# Patient Record
Sex: Male | Born: 1935 | Race: White | Hispanic: No | Marital: Married | Smoking: Never smoker
Health system: Southern US, Community
[De-identification: ages and names within clinical notes are randomized; demographics above are authoritative.]

## PROBLEM LIST (undated history)

## (undated) DIAGNOSIS — I10 Essential (primary) hypertension: Secondary | ICD-10-CM

## (undated) DIAGNOSIS — S065X9A Traumatic subdural hemorrhage with loss of consciousness of unspecified duration, initial encounter: Secondary | ICD-10-CM

## (undated) DIAGNOSIS — C61 Malignant neoplasm of prostate: Secondary | ICD-10-CM

## (undated) DIAGNOSIS — R600 Localized edema: Secondary | ICD-10-CM

## (undated) HISTORY — PX: BRAIN SURGERY: SHX531

---

## 2000-06-30 ENCOUNTER — Encounter: Payer: Self-pay | Admitting: Internal Medicine

## 2000-06-30 ENCOUNTER — Encounter: Admission: RE | Admit: 2000-06-30 | Discharge: 2000-06-30 | Payer: Self-pay | Admitting: Internal Medicine

## 2002-02-21 ENCOUNTER — Ambulatory Visit: Admission: RE | Admit: 2002-02-21 | Discharge: 2002-05-01 | Payer: Self-pay | Admitting: Family Medicine

## 2004-10-20 ENCOUNTER — Ambulatory Visit: Payer: Self-pay | Admitting: Gastroenterology

## 2004-11-16 ENCOUNTER — Ambulatory Visit: Payer: Self-pay | Admitting: Gastroenterology

## 2004-11-23 ENCOUNTER — Ambulatory Visit: Payer: Self-pay | Admitting: Gastroenterology

## 2006-01-14 ENCOUNTER — Ambulatory Visit: Payer: Self-pay | Admitting: Gastroenterology

## 2006-02-03 ENCOUNTER — Ambulatory Visit: Payer: Self-pay | Admitting: Gastroenterology

## 2006-02-25 ENCOUNTER — Ambulatory Visit (HOSPITAL_COMMUNITY): Admission: RE | Admit: 2006-02-25 | Discharge: 2006-02-25 | Payer: Self-pay | Admitting: Internal Medicine

## 2006-02-25 ENCOUNTER — Ambulatory Visit: Payer: Self-pay | Admitting: Gastroenterology

## 2007-07-25 ENCOUNTER — Ambulatory Visit: Payer: Self-pay | Admitting: Gastroenterology

## 2007-07-27 DIAGNOSIS — S065XAA Traumatic subdural hemorrhage with loss of consciousness status unknown, initial encounter: Secondary | ICD-10-CM

## 2007-07-27 DIAGNOSIS — S065X9A Traumatic subdural hemorrhage with loss of consciousness of unspecified duration, initial encounter: Secondary | ICD-10-CM

## 2007-07-27 HISTORY — DX: Traumatic subdural hemorrhage with loss of consciousness of unspecified duration, initial encounter: S06.5X9A

## 2007-07-27 HISTORY — DX: Traumatic subdural hemorrhage with loss of consciousness status unknown, initial encounter: S06.5XAA

## 2007-10-30 ENCOUNTER — Encounter: Admission: RE | Admit: 2007-10-30 | Discharge: 2007-10-30 | Payer: Self-pay | Admitting: Neurosurgery

## 2007-11-08 ENCOUNTER — Inpatient Hospital Stay (HOSPITAL_COMMUNITY): Admission: AD | Admit: 2007-11-08 | Discharge: 2007-11-14 | Payer: Self-pay | Admitting: Neurosurgery

## 2007-11-13 ENCOUNTER — Ambulatory Visit: Payer: Self-pay | Admitting: Physical Medicine & Rehabilitation

## 2007-11-14 ENCOUNTER — Inpatient Hospital Stay (HOSPITAL_COMMUNITY)
Admission: RE | Admit: 2007-11-14 | Discharge: 2007-11-21 | Payer: Self-pay | Admitting: Physical Medicine & Rehabilitation

## 2008-01-01 ENCOUNTER — Encounter
Admission: RE | Admit: 2008-01-01 | Discharge: 2008-01-03 | Payer: Self-pay | Admitting: Physical Medicine & Rehabilitation

## 2008-01-03 ENCOUNTER — Ambulatory Visit: Payer: Self-pay | Admitting: Physical Medicine & Rehabilitation

## 2008-01-10 ENCOUNTER — Encounter: Payer: Self-pay | Admitting: Gastroenterology

## 2008-01-11 ENCOUNTER — Encounter: Admission: RE | Admit: 2008-01-11 | Discharge: 2008-01-11 | Payer: Self-pay | Admitting: Neurosurgery

## 2008-04-22 ENCOUNTER — Emergency Department (HOSPITAL_COMMUNITY): Admission: EM | Admit: 2008-04-22 | Discharge: 2008-04-22 | Payer: Self-pay | Admitting: Emergency Medicine

## 2009-07-31 IMAGING — CT CT HEAD W/O CM
1 series · 15 of 30 positions shown, 19 images · non-contrast
Comparison: [place] 10/07/[place].
The images are not available for review.

CLINICAL DATA: Follow-up subdural hematoma

CT HEAD WITHOUT CONTRAST
TECHNIQUE: Contiguous axial images were obtained from the base of
the skull through the vertex without contrast.

[Series 33: 3d filtered head · axial · 0.49mm/px · z∈[+17,+166]mm · 15 of 32 slices shown, 19 images]
[im 2/32  brain]
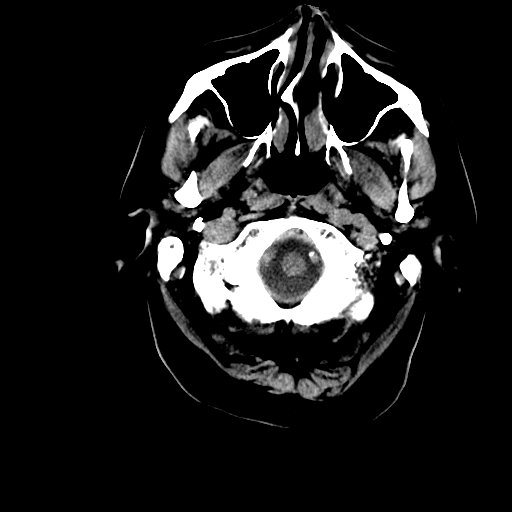
[im 2/32  bone]
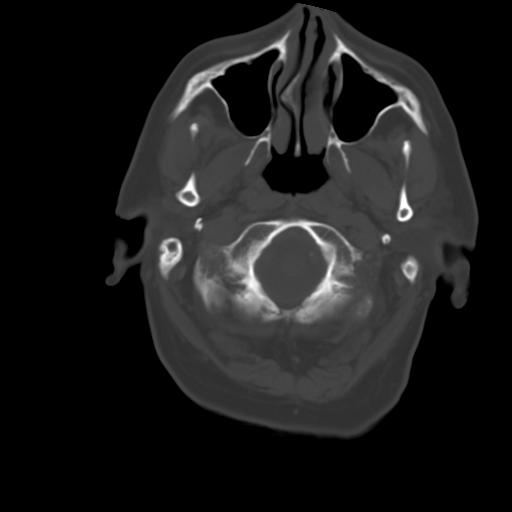
[im 4/32  brain]
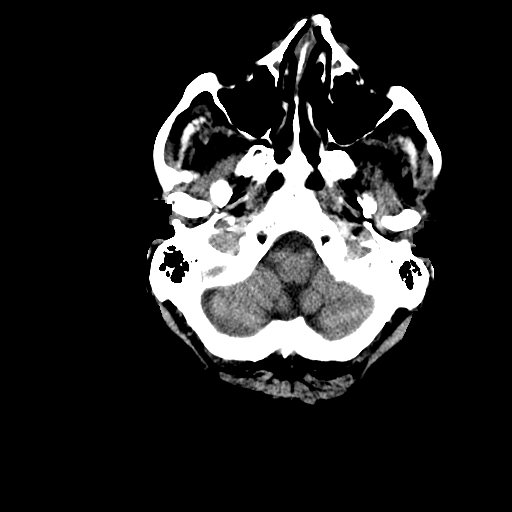
[im 6/32  brain]
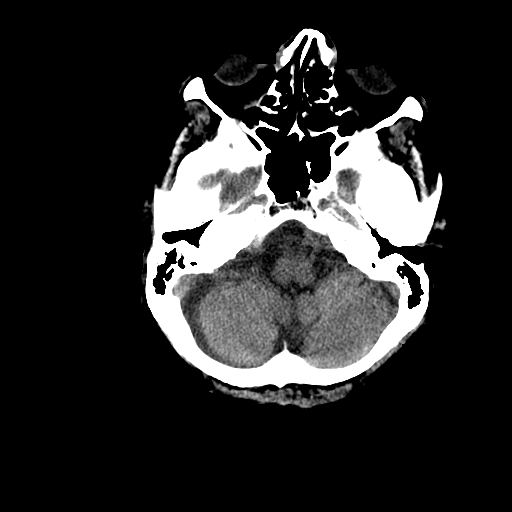
[im 8/32  brain]
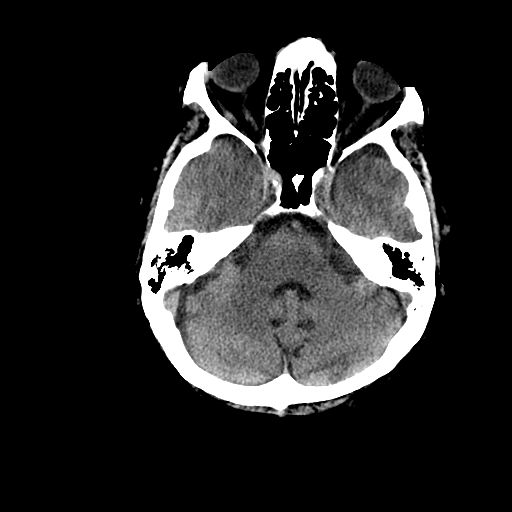
[im 10/32  brain]
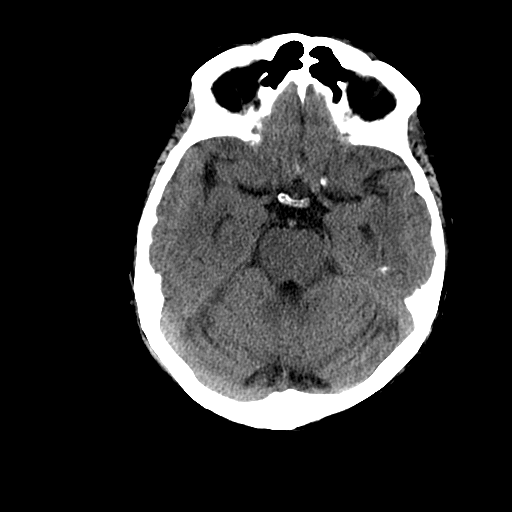
[im 10/32  bone]
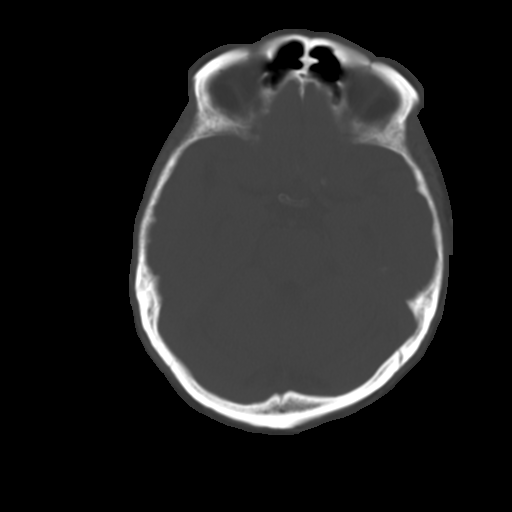
[im 12/32  brain]
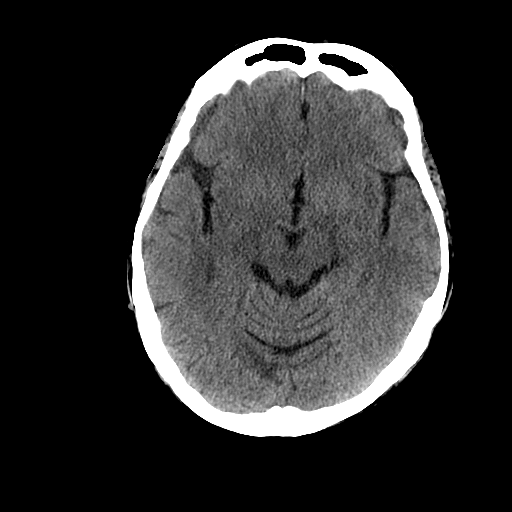
[im 14/32  brain]
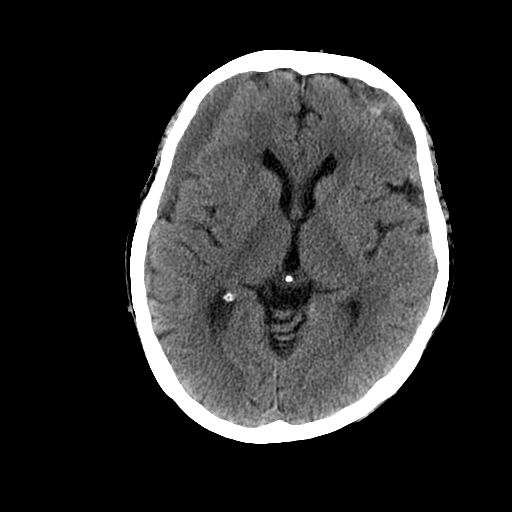
[im 17/32  brain]
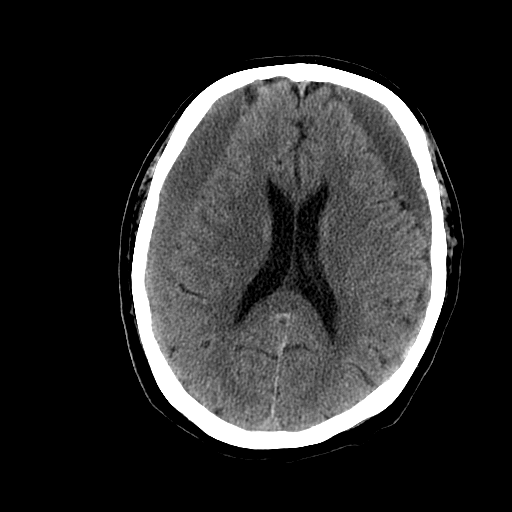
[im 18/32  brain]
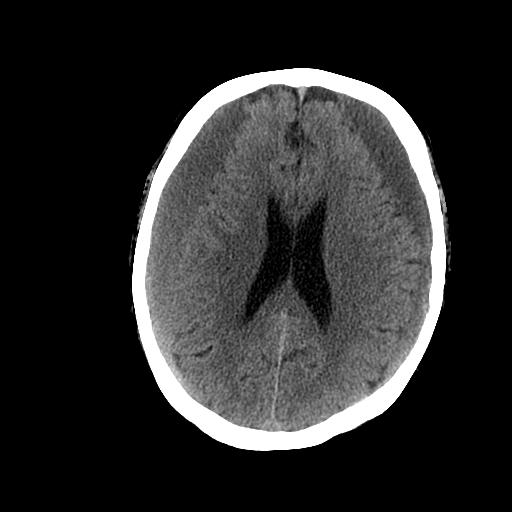
[im 18/32  bone]
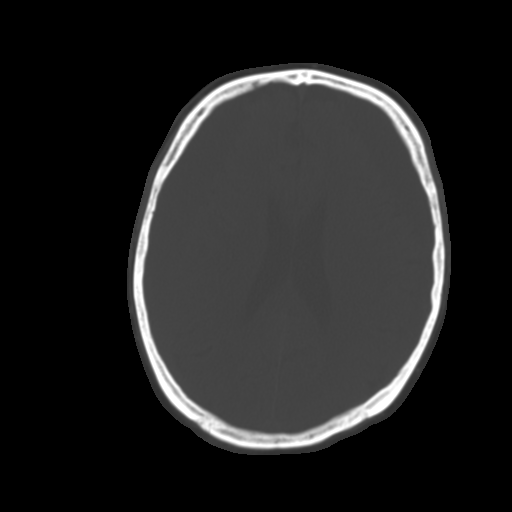
[im 20/32  brain]
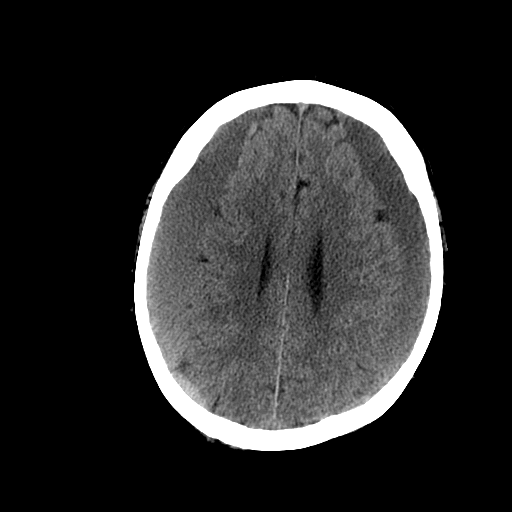
[im 22/32  brain]
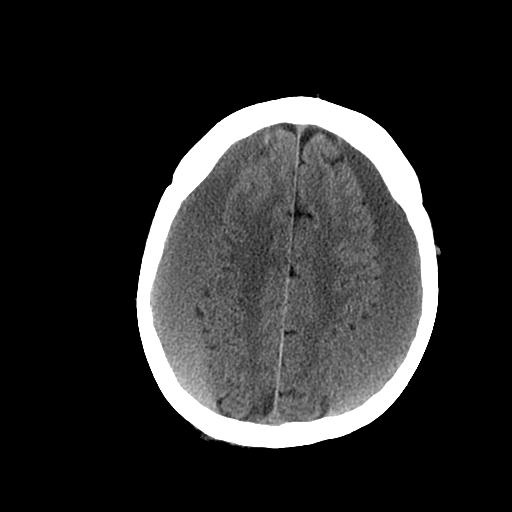
[im 24/32  brain]
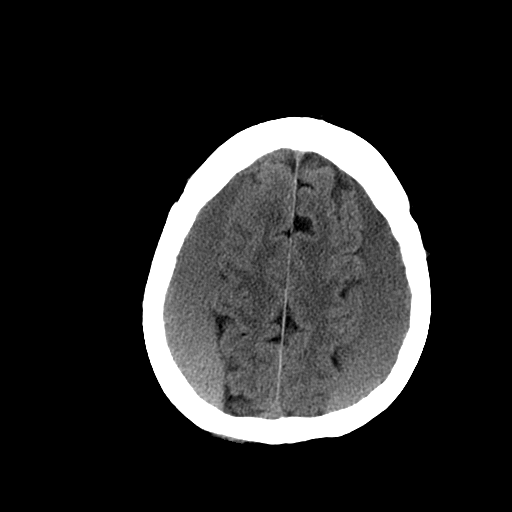
[im 26/32  brain]
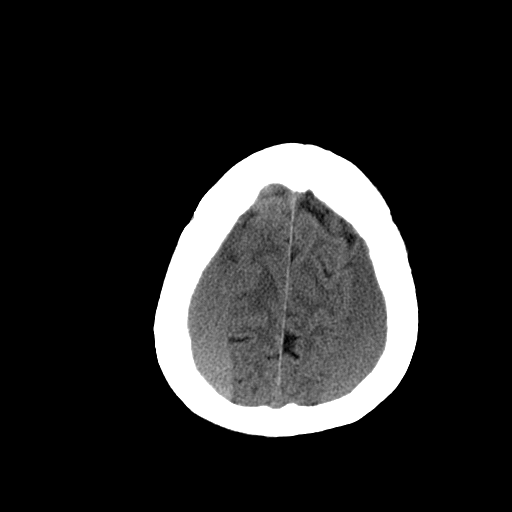
[im 26/32  bone]
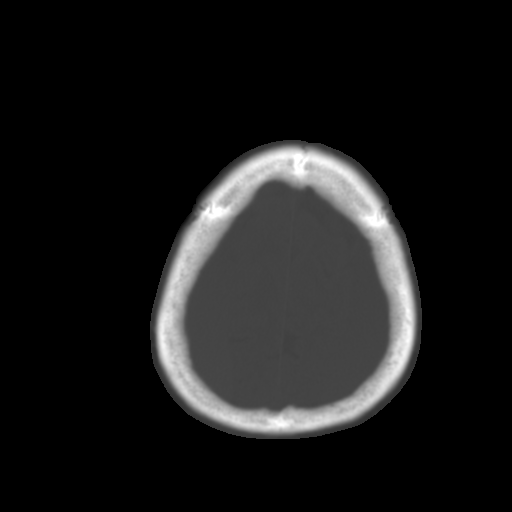
[im 28/32  brain]
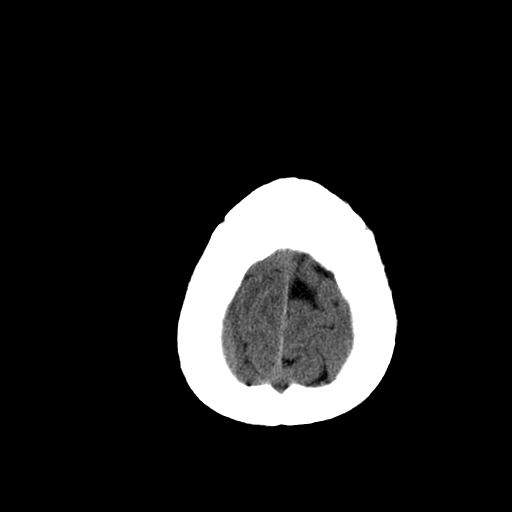
[im 30/32  brain]
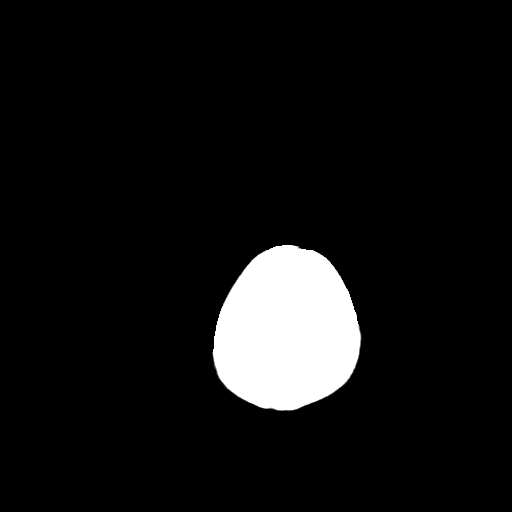

[15 of 30 positions shown; findings below may reference images not displayed]

FINDINGS: Bilateral subdural fluid collection are noted which are
moderate to large.  This measures approximate 19 mm in thickness on
the right and 15 mm in thickness on the left.  Since the subdural
fluid collections are relatively similar in size there is no
midline shift. Subdural fluid collections are approximately
isodense to brain suggesting these are subacute or chronic subdural
hematomas.  There is some mild high density blood in the left
frontal subdural space compatible with more recent hemorrhage.
This is not described on the prior report.

The ventricles are not enlarged.  There is no acute infarct.  No
underlying mass lesion is identified.  The prior report  describes
some opacification the right mastoid sinus which is now clear.  The
left mastoid sinus and the paranasal sinuses are all clear.
IMPRESSION: Moderate to large bilateral subdural fluid collections compatible
with subacute hematomas which are isodense to brain.  There has
been some more recent hemorrhage in the left frontal subdural
space.  There is no midline shift and there is no acute infarct.

## 2009-08-10 IMAGING — CT CT HEAD W/O CM
1 series · 16 of 30 positions shown, 20 images · non-contrast
Comparison: 10/30/2007

CLINICAL DATA: Follow-up subdural hematoma

CT HEAD WITHOUT CONTRAST
TECHNIQUE: Contiguous axial images were obtained from the base of
the skull through the vertex without contrast.

[Series 2: brain · axial · 0.47mm/px · z∈[+146,+297]mm · 16 of 32 slices shown, 20 images]
[im 2/32  brain]
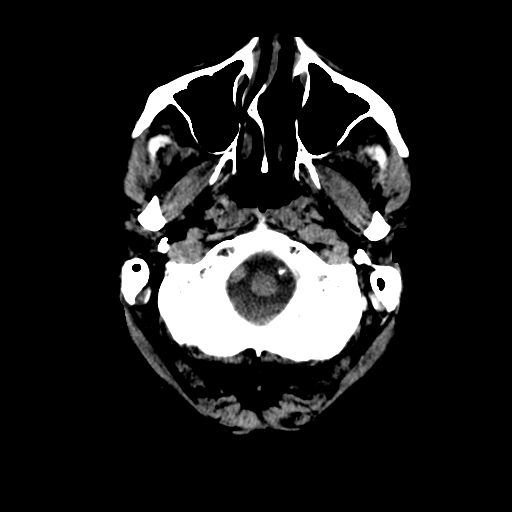
[im 2/32  bone]
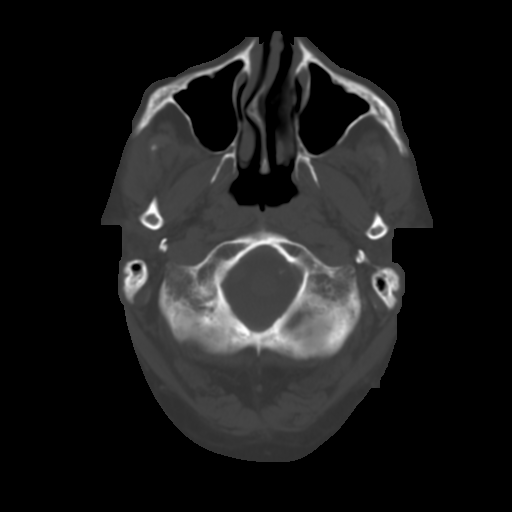
[im 4/32  brain]
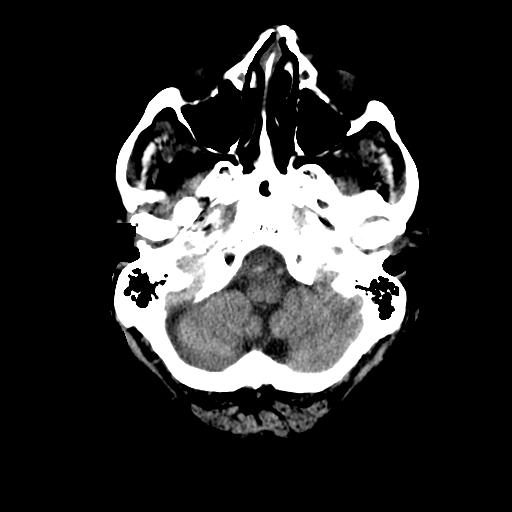
[im 6/32  brain]
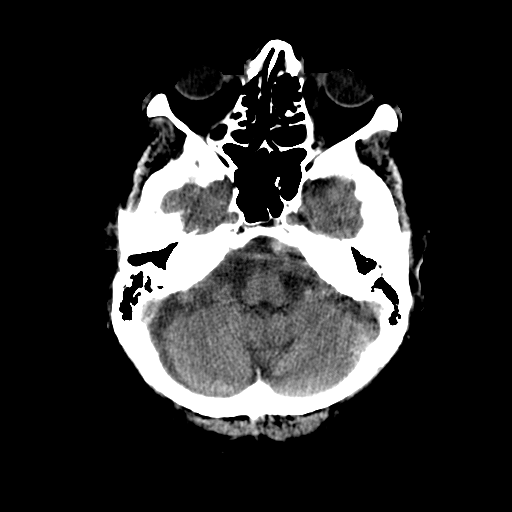
[im 8/32  brain]
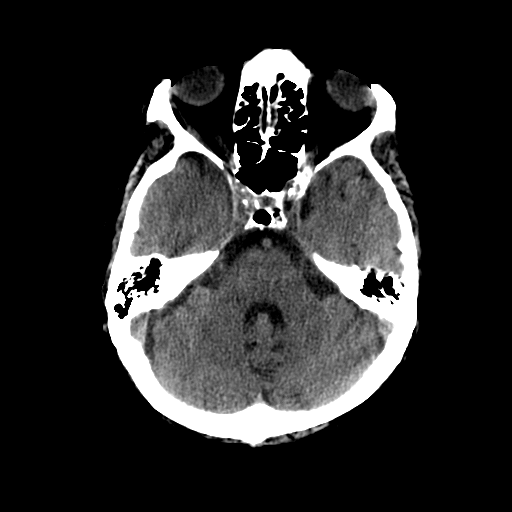
[im 9/32  brain]
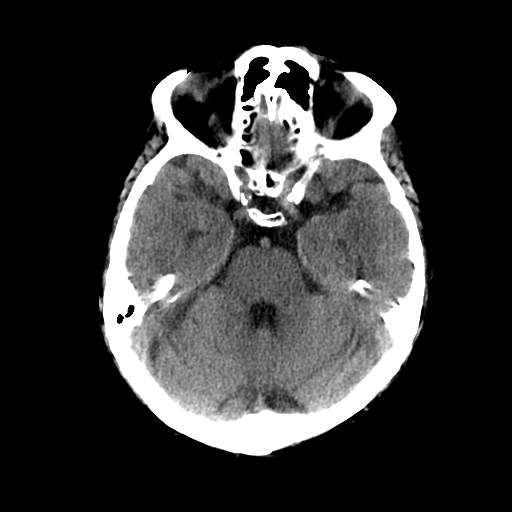
[im 9/32  bone]
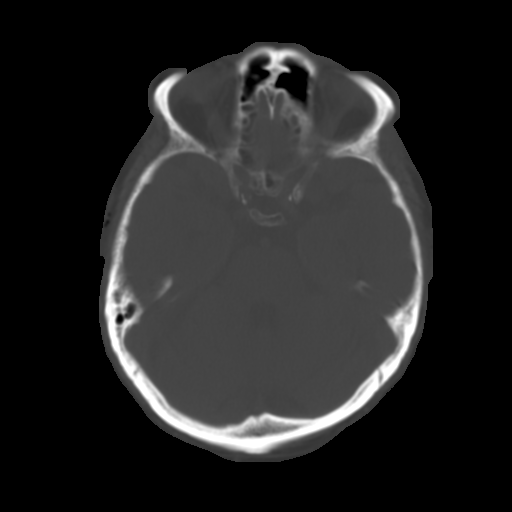
[im 11/32  brain]
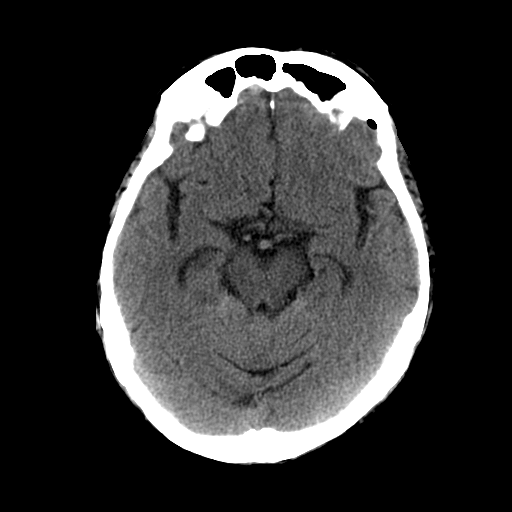
[im 13/32  brain]
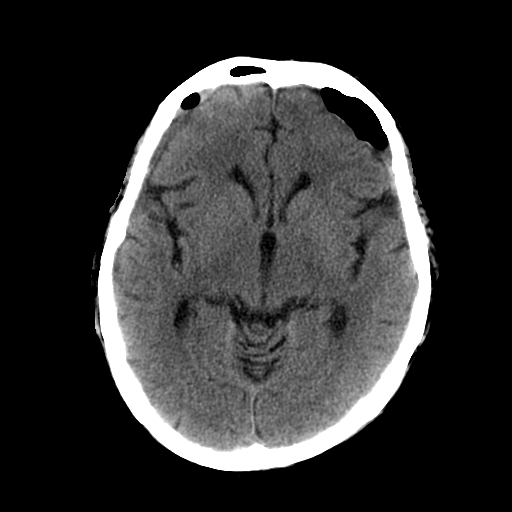
[im 15/32  brain]
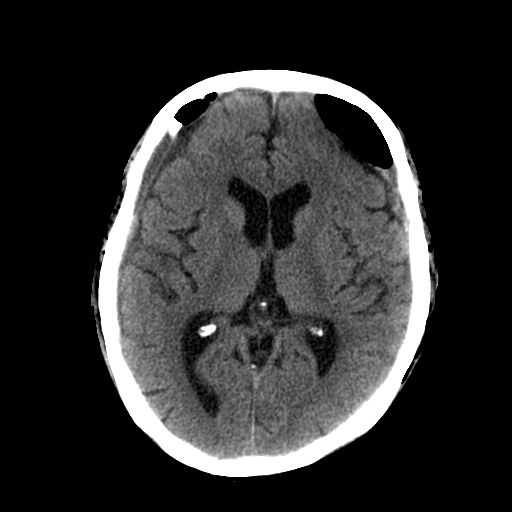
[im 17/32  brain]
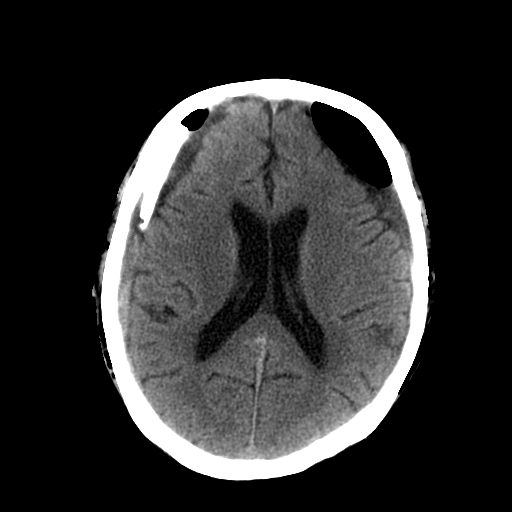
[im 17/32  bone]
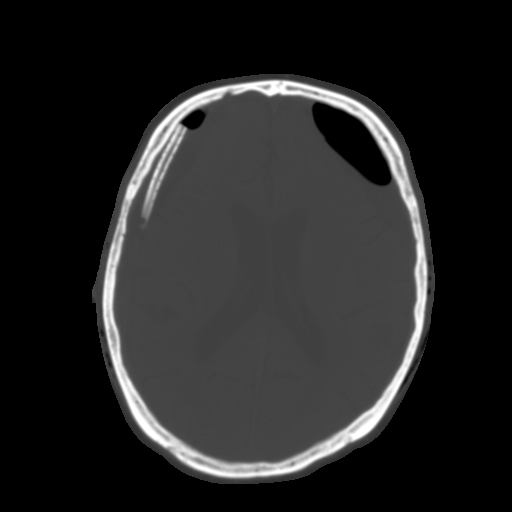
[im 19/32  brain]
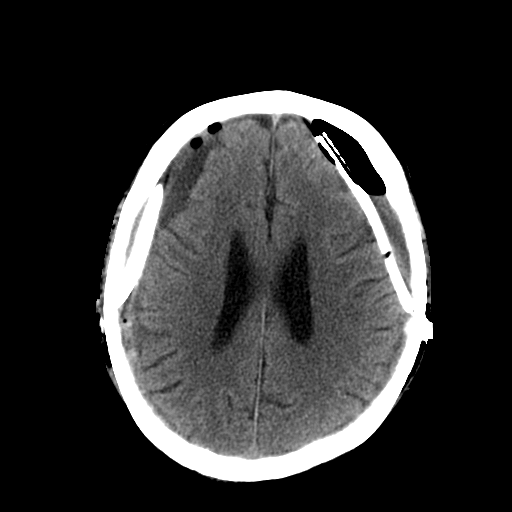
[im 21/32  brain]
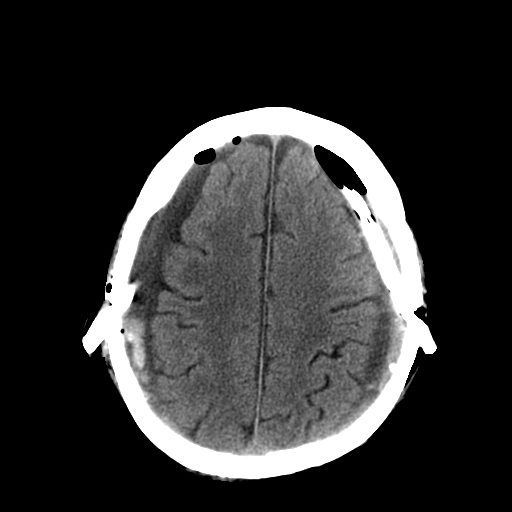
[im 23/32  brain]
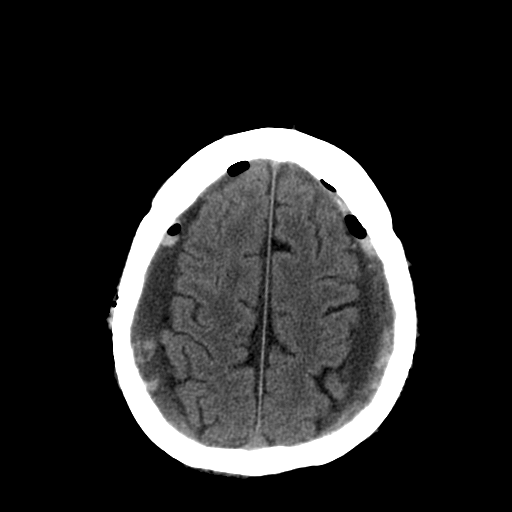
[im 24/32  brain]
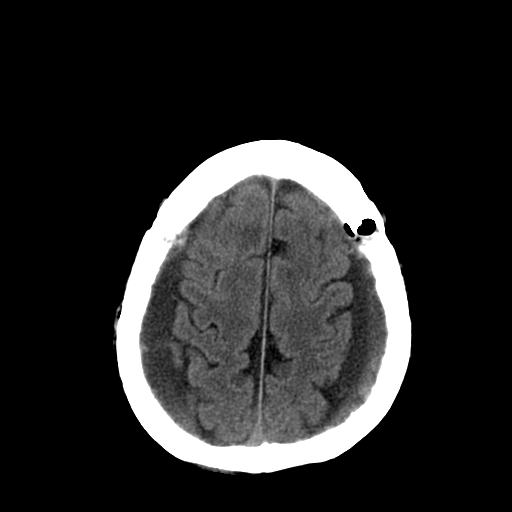
[im 24/32  bone]
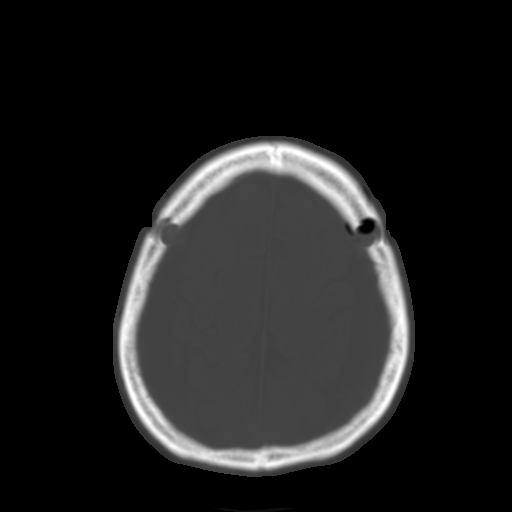
[im 26/32  brain]
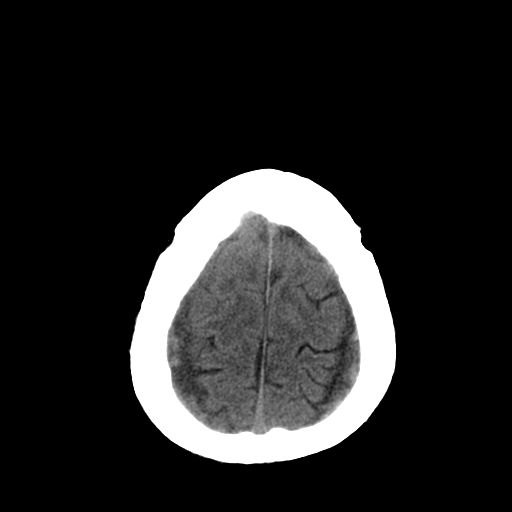
[im 28/32  brain]
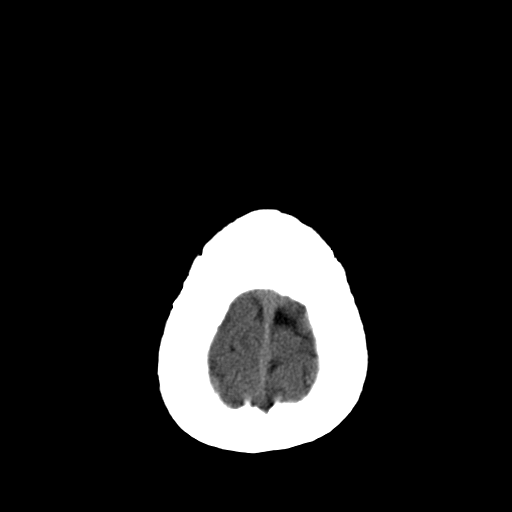
[im 30/32  brain]
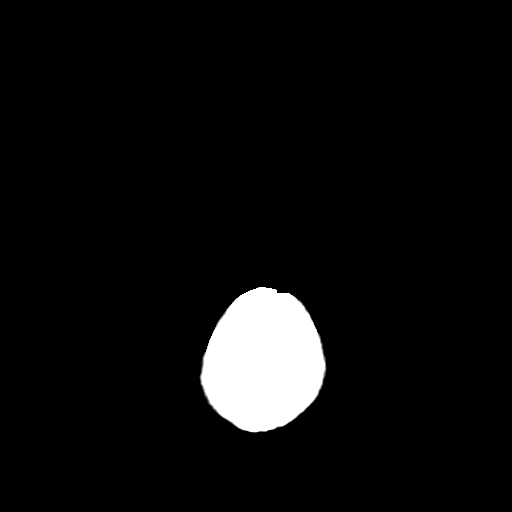

[16 of 30 positions shown; findings below may reference images not displayed]

FINDINGS: Bilateral subdural drains are in place.  There is some
residual subdural fluid, air and blood bilaterally, but the
collections are smaller than were seen 10 days ago.  Previously,
thickness was of as much as 22 mm.  Today measuring the same
location shows thickness of 13 mm.  No intraparenchymal pathology
is evident.  No hydrocephalus, mass lesion or infarction.  Sinuses
are clear.
IMPRESSION: Bilateral subdural drains are in place with reduction in size of
bilateral subdural collections.  Some fluid, air and blood does
persist bilaterally, but the volume is considerably reduced when
compared to the examination of 10 days ago.

## 2009-10-31 ENCOUNTER — Encounter (INDEPENDENT_AMBULATORY_CARE_PROVIDER_SITE_OTHER): Payer: Self-pay | Admitting: *Deleted

## 2010-08-25 NOTE — Letter (Signed)
Summary: Colonoscopy Letter  [facility]  [address] [place], [place] [address]   Phone: [phone]  Fax: [phone]      October 31, 2009 Darren Rodriguez   [facility] [address] [place], [place]  [address]   Dear Mr. CAWOOD,   According to your medical record, it is time for you to schedule a Colonoscopy. The American Cancer Society recommends this procedure as a method to detect early colon cancer. Patients with a family history of colon cancer, or a personal history of colon polyps or inflammatory bowel disease are at increased risk.  This letter has beeen generated based on the recommendations made at the time of your procedure. If you feel that in your particular situation this may no longer apply, please contact our office.  Please call our office at [phone] to schedule this appointment or to update your records at your earliest convenience.  Thank you for cooperating with [place] to provide you with the very best care possible.   Sincerely,   Vania Rea. Jarold Motto, M.D.  [facility]astro[facility] [phone]

## 2010-12-08 NOTE — H&P (Signed)
NAME:  Darren Rodriguez, Darren Rodriguez NO.:  [id number]   MEDICAL RECORD NO.:  [id number]          PATIENT TYPE:   LOCATION:                                 FACILITY:   PHYSICIAN:  Payton Doughty, M.D.           DATE OF BIRTH:   DATE OF ADMISSION:  DATE OF DISCHARGE:                              HISTORY & PHYSICAL   DATE OF ADMISSION:  October 08, 2007.   ADMISSION DIAGNOSIS:  Bilateral subdural hematomas, chronic.   HISTORY:  This is a very nice 77 year old right-handed white gentleman,  who I saw a couple weeks ago in my office.  In mid March, he had fallen  and hit his head, [facility], got a CT showed bilateral  chronic subdural hygromas.  He visited with me on October 24, 2007,  admitted and was getting better, then felt too bad, had some odd hearing  in his right ear and little bit of facial weakness.  Over the past  couple weeks, he has had increasing difficulty with his gait and falling  around.  CT was repeated shows increased size of the subdural and he is  now admitted for drainage.  Medical history is benign.  He has  hypertension.  He is on Diovan, Triaz, Lasix, lisinopril, lovastatin,  omeprazole, Norvasc, potassium, and aspirin.   ALLERGIES:  None.   OPERATION:  Prostate.   SOCIAL HISTORY:  He does not smoke, drink slightly on a daily basis  since retired.   FAMILY HISTORY:  Noncontributory.   REVIEW OF SYSTEMS:  Marked for glasses, hypertension, and prostate  cancer.  His HEENT exam, normal limits.  He has good range of motion of  neck.  Chest is clear.  CARDIAC:  Regular rate and rhythm.  ABDOMEN:  Nontender.  No hepatosplenomegaly.  EXTREMITIES:  Without clubbing or  cyanosis.  GU:  Peripheral pulses are good.  NEUROLOGICALLY:  He is  awake, alert, and oriented.  His pupils equal, round, reactive to light.  His extraocular movements are intact.  His facial movement slightly  pressed on the right side, but better than it was couple weeks  ago.  There is no swallowing difficulties.  Shoulder shrug is normal.  Hearing  appears to be equal.  He has bilateral pronator drift and his gait is  somewhat dystaxic.  CT shows bilateral hygromas with increasing mass  effect.   CLINICAL IMPRESSION:  Subdural hematoma/hygroma with this mass effect.   PLAN:  The plan is for bilateral bur hole evacuation.  The risks and  benefits have been discussed with him and he wishes to proceed.          ______________________________  Payton Doughty, M.D.    MWR/MEDQ  D:  11/08/2007  T:  11/09/2007  Job:  [id number]

## 2010-12-08 NOTE — Assessment & Plan Note (Signed)
[facility]                         GASTROENTEROLOGY OFFICE NOTE   ADOLPHUS, HANF                      MRN:          [id number]  DATE:07/25/2007                            DOB:          1924-04-08    Mr. Benish last had an endoscopy and dilatation in August 2007.  He  currently is asymptomatic and denies any dysphagia or gastrointestinal  problems.  He comes to the office today for renewal of his omeprazole 20  mg a day, which is taken 30 minutes before breakfast.  I renewed his  prescription and reviewed chronic reflux regimen with him.  He and his  wife both desired primary care establishment and I will try to schedule  this with Dr. Artist Pais if possible.  I will see him on a p.r.n. basis as  needed should his dysphagia return.     Vania Rea. Jarold Motto, MD, Caleen Essex, FAGA  Electronically Signed    DRP/MedQ  DD: 07/25/2007  DT: 07/25/2007  Job #: [id number]   cc:   Barbette Hair. Artist Pais, DO

## 2010-12-08 NOTE — Op Note (Signed)
NAME:  Darren Rodriguez, Darren Rodriguez NO.:  [id number]   MEDICAL RECORD NO.:  [id number]          PATIENT TYPE:  INP   LOCATION:  3106                         FACILITY:  [facility]   PHYSICIAN:  Payton Doughty, M.D.      DATE OF BIRTH:  [birth year]   DATE OF PROCEDURE:  11/08/2007  DATE OF DISCHARGE:                               OPERATIVE REPORT   PREOPERATIVE DIAGNOSIS:  Bilateral chronic subdural hematomas.   POSTOPERATIVE DIAGNOSIS:  Bilateral chronic subdural hematomas.   PROCEDURES:  Evacuation of bilateral chronic subdural hematomas by bur  hole.   DICTATING DOCTOR:  Payton Doughty, MD.   ANESTHESIA:  General endotracheal.   PREP:  __________ with alcohol wipe.   COMPLICATIONS:  None.   NURSE ASSISTANT:  [place].   BODY TEXT:  An 77 year old gentleman with bilateral chronic subdural  hematomas taken to the operating room, __________  intubated, placed  supine on the operating table.  Following shave, prep and drape in the  usual sterile fashion, skin flaps were planed out to do a craniotomy and  directly above the ear halfway to the vertex and halfway between the  vertex and the first incision.  Slightly forward bur holes were created  through 3 cm incisions.  Through these, the dura was coagulated and  opened and subdural hematoma was allowed to egress.  The left side  appeared a bit more chronic, the right side was a little bit darker.  There was no fresh blood in either one.  The brain did not come up very  much; however, it was pulsatile and was not compressed.  Therefore,  drains were placed in both posterior bur holes.  They were 10-mm Al Pimple drains.  There were exited via a separate incision just posterior  to the main incision.  There were connected to the catheter six.  Each  side was washed out until it was clear with no blood being drained.  This was accomplished on each side without difficulty.  The incisions  were then closed with 2-0 Vicryl and 3-0  nylon.  The drains were secured  with 3-0 nylon.  Betadine and Telfa dressings were applied and the  patient returned to recovery room in good condition.           ______________________________  Payton Doughty, M.D.     MWR/MEDQ  D:  11/08/2007  T:  11/09/2007  Job:  [id number]

## 2010-12-08 NOTE — H&P (Signed)
NAME:  PERLE, GIBBON NO.:  [id number]   MEDICAL RECORD NO.:  [id number]           PATIENT TYPE:   LOCATION:                                 FACILITY:   PHYSICIAN:  Ranelle Oyster, M.D.DATE OF BIRTH:  04/23/1924   DATE OF ADMISSION:  11/14/2007  DATE OF DISCHARGE:                              HISTORY & PHYSICAL   CHIEF COMPLAINT:  Weakness and gait disorder.   HISTORY OF PRESENT ILLNESS:  This is a 77 year old white male with a  fall in March.  Head CT revealed bilateral chronic subdural hematomas.  The patient did well initially.  He underwent bur hole evacuation of the  bilateral subdural hemorrhages by Dr. Channing Mutters.  He had subdural drain  placed, which was discontinued within the first few days  postoperatively.  Followup head CT in April 20 revealed no interval  acute hemorrhage.  The patient did have some urinary retention problems  and Flomax was added.  He is on Dilantin for seizure prophylaxis.  The  patient is requiring min assist for transfers and mobility with max Q's.  He is requiring min-to-mod assist with basic ADLs.  The patient,  therefore, is admitted to the inpatient rehab today to improve upon  these functional physical deficits.   REVIEW OF SYSTEMS:  Notable for reflux, weakness and stiffness of both  knees.  He denies any headache.  Moods has been fair.  He is moving his  bowels with a BM noted today.  Full review is in written H and P.   PAST MEDICAL HISTORY:  Positive for hypertension, prostate cancer status  post prostatectomy, esophageal dilatation for esophageal ring in 2007.  Status post epidural steroid injection x2.  He has had symptoms of  radiculopathy in the left lower extremity.   FAMILY HISTORY:  Positive for cancer.   SOCIAL HISTORY:  The patient is married, lives in one-level house and  one-step to enter.  Wife is legally blind and needs some assistance.  He  was her primary caregiver prior to arrival.  The patient drinks  2  glasses of wine per day.   FUNCTIONAL HISTORY:  The patient is independent and driving prior to  arrival.   MEDICATIONS:  1. Lasix.  2. Prinivil.  3. Omeprazole.  4. Norvasc.  5. Aspirin.  6. K-Dur.  7. Lovastatin.  8. Terazosin.   LABORATORY DATA:  Hemoglobin 12.8, white count 5.6, and platelets 162.  Sodium 139, potassium 4.4, BUN 17, creatinine 0.95, AST 20, ALT 15,  albumin 3.5, and total bilirubin 0.5.   PHYSICAL EXAMINATION:  VITAL SIGNS:  Blood pressure is 132/84, pulse 68,  respiratory rate 20, and temperature 98.7.  GENERAL:  The patient is pleasant, alert and oriented x3.  HEENT:  Pupils are equal, round, and reactive to light.  Ear, nose, and  throat exam are unremarkable with fair dentition, and pink moist mucosa.  Head is notable for wounds with sutures.  Wounds are all well  approximated and dry.  NECK:  Supple without JVD or lymphadenopathy.  CHEST:  Clear to auscultation bilaterally without wheezes, rales,  or  rhonchi.  HEART:  Regular rate and rhythm without murmur, rub, or gallops.  ABDOMEN:  Soft and nontender.  Bowel sounds are positive.  SKIN:  Notable for the above as well as some stasis changes in distal  lower extremities.  NEUROLOGICALLY:  Cranial nerves II through XII are intact.  Reflexes are  2+ and sensation was normal.  Strength was 3 to 3+/5 proximal, 4+/5  distally in the upper and lower extremities.  He did have some  difficulties with fine motor coordination.  Vision, general, improving  nicely there.  Judgment, orientation, memory, and mood seemed to be all  within functional limits today.   ASSESSMENT/PLAN:  1. Functional deficit secondary to bilateral subdural hemorrhages,      status post bur hole evacuation on postoperative day #6.  He is in      comprehensive inpatient rehab.  Care is provided in this setting      due to the fact that a lower level of care cannot meet his physical      or medical needs.  PT will assess the  patient for range of motion,      balance, safety, and gait.  OT will assess and treat for range of      motion, ADLs, and cognitive-perceptual training.  Speech language      pathology will perform a cognitive screen, although the patient      seems to be functioning nicely there.  Rehab nurse will assess and      treat him on 24-hour basis for bowel, bladder, skin, medication,      and safety issues.  Rehab case manager/social worker will assess      for psychosocial needs and discharge planning.  Estimated length of      stay is 1 week.  Goals, supervision to modified independent.      Prognosis good.  2. Seizure prophylaxis with Dilantin:  Check level in the morning.  3. Hypertension:  Continue Lasix, Diovan, Prinivil, and Norvasc.      Resume KCl as well.  4. GU:  We will check UA, C and S, and scan for PVRs.  The patient is      continent currently.  5. Anorexia postop:  Had Resource supplement.  RD consult also.  6. Dysphagia and esophageal ring:  Continue Protonix as premorbidly      the patient was on.  7. Deep venous thrombosis prophylaxis with PSO's and TED's with      ambulation as well.      Ranelle Oyster, M.D.  Electronically Signed     ZTS/MEDQ  D:  11/14/2007  T:  11/15/2007  Job:  [id number]

## 2010-12-08 NOTE — Assessment & Plan Note (Signed)
Mr. Belshe returns to clinic today for followup evaluation.  He is an 77-  year-old male who fell a few weeks prior to admission in March 2009.  A  cranial CT revealed bilateral chronic subdural hematomas.  Initially he  did well, but started developing hearing problems along with gait  difficulties.  Follow up cranial CT showed enlarged bilateral subdural  hematoma with subacute and more recent hemorrhages in the left frontal  space.  The patient was admitted on November 08, 2007, for a burr hole  evacuation of the bilateral subdural hemorrhages by Dr. Channing Mutters.  Subdural  drains were placed by the surgeon and recently were discontinued.  The  patient was moved to the Rehabilitation Unit on November 14, 2007, and  remained there until discharge on November 21, 2007.   Since discharge, the patient has followed up with Dr. Channing Mutters with no new  changes made.  He is due to follow up with Dr. Channing Mutters this coming Monday.  He continues to be followed by Dr. Patsy Lager as his primary care through  urgent care.  They are due for followup approximately in a month with  the last followup 2-3 weeks ago.  The patient has a followup appointment  with Dr. Alanda Amass next Wednesday, January 10, 2008.   The patient reports that he is independent for all bathing and dressing,  and all activities including driving and ambulation.  He has  discontinued use of the pain medicines as he reports no pain whatsoever.  His blood pressure medicines have remained essentially unchanged from  his hospitalization.   MEDICATIONS:  1. Lasix 40 mg.  2. Diovan 320 mg q.p.m.  3. K-Dur 20 mEq.  4. Hytrin 2 mg 3 tablets q.a.m.  5. Zocor 20 mg.  6. Lisinopril 30 mg b.i.d.  7. Omeprazole 20 mg.  8. Norvasc 5 mg daily.   REVIEW OF SYSTEMS:  Noncontributory.   PHYSICAL EXAMINATION:  Well-appearing, fit adult male, in no acute  discomfort.  Vitals were not obtained in the office today.  He ambulates  without any assistive device.  He has 5/5  strength throughout the  bilaterally upper and lower extremities.  Bulk and tone were normal.  Reflexes were 2+ and symmetrical.   IMPRESSION:  1. Status post burr hole evacuation of bilateral subdural hemorrhages.  2. Hypertension.  3. Dyslipidemia.   In the office today, no adjustments in his medicines were made.  We will  plan on seeing the patient in followup on an as-needed basis.  I have  asked him to bring up the lower extremity swelling that he reports with  his primary care physician and specifically with Dr. Alanda Amass when they  meet next week.  We will plan on seeing him in followup on an as-needed  basis.           ______________________________  Ellwood Dense, M.D.    DC/MedQ  D:  01/03/2008 11:08:04  T:  01/04/2008 02:44:26  Job #:  [id number]

## 2010-12-08 NOTE — Discharge Summary (Signed)
NAME:  BERNIS, SCHREUR NO.:  [id number]   MEDICAL RECORD NO.:  [id number]          PATIENT TYPE:  INP   LOCATION:  3014                         FACILITY:  [facility]   PHYSICIAN:  Hewitt Shorts, M.D.DATE OF BIRTH:  September 16, 1923   DATE OF ADMISSION:  11/08/2007  DATE OF DISCHARGE:  11/14/2007                               DISCHARGE SUMMARY   HISTORY OF PRESENT ILLNESS:  The patient is an 77 year old man patient  of Dr. Trey Sailors.  Dr. Channing Mutters had been following for subdural hematomas.  Followup CT scan showed enlargement of the subdural hematoma and Dr. Channing Mutters  elected to admit the patient for evacuation of the subdurals.  Details  of the admission history and physical examination are included in Dr.  Temple Pacini admission note.   HOSPITAL COURSE:  The patient was admitted, taken to surgery by Dr. Channing Mutters  for bilateral evacuation of subdural hematoma with burr hole.  He has  done well.  He had drains in.  Dr. Lovell Sheehan removed those drains during  the postoperative period and followup CT scan shows good evacuation of  the subdural hematomas with mild recurrence, but overall improved as  compared to prior to surgery with less mass effect.  The patient was  seen by physical therapy in the postoperative period, and physical  medicine rehabilitation consultation was requested.  They feel that the  patient would be a good patient for comprehensive inpatient  rehabilitation.  Therefore, the patient has been transferred to the  rehabilitation service with prior to subsequent followup with Dr. Channing Mutters.  The patient's wounds are healing well.  He is afebrile.   DISCHARGE DIAGNOSIS:  Bilateral subdural hematomas.      Hewitt Shorts, M.D.  Electronically Signed     RWN/MEDQ  D:  11/14/2007  T:  11/14/2007  Job:  [id number]

## 2010-12-08 NOTE — Discharge Summary (Signed)
NAME:  Darren Rodriguez, Darren Rodriguez NO.:  [id number]   MEDICAL RECORD NO.:  [id number]          PATIENT TYPE:  IPS   LOCATION:  4003                         FACILITY:  [facility]   PHYSICIAN:  Greg Cutter, P.A. DATE OF BIRTH:  05-22-1924   DATE OF ADMISSION:  11/14/2007  DATE OF DISCHARGE:  11/21/2007                               DISCHARGE SUMMARY   DISCHARGE DIAGNOSES:  1. Acute on chronic bilateral subdural hemorrhage requiring      craniotomy.  2. Hypertension.  3. Urinary retention, resolved.  4. Anorexia, resolved.   HISTORY OF PRESENT ILLNESS:  Darren Rodriguez is an 77 year old male who fell  few weeks prior to admission in March with CCT revealing bilateral  chronic subdural hematomas.  The patient did well initially but started  developing right hearing problems as well as gait difficulty.  Followup  CCT showed moderately large bilateral subdural hemorrhage, subacute with  more recent hemorrhage in left frontal space.  The patient was admitted  on November 08, 2007, for burr hole evacuation of bilateral subdural  hemorrhages by Dr. Channing Mutters.  Subdural drains placed by Surgery and have been  recently discontinued.  Repeat CT of head on November 13, 2007, shows no  internal acute hemorrhage.  In postop, the patient has had issues with  urinary retention problems and Flomax was added to help with  symptomatology.  The patient also continues on Dilantin for seizure  prophylaxis.  Therapy is initiated, and the patient is main assist for  transfers using momentum to help bring bodyweight forward, main assist  for ambulating 70 feet with staggering gait and loss of balance noted.  Rehabilitation consulted for further therapies.   PAST MEDICAL HISTORY:  Significant for:  1. Hypertension.  2. Prostate cancer with prostatectomy.  3. History of esophageal ring, status post dilatation in 2007.  4. HNP lumbar spine with history of sciatica left lower extremity.   ALLERGIES:  No known drug  allergies.   FAMILY HISTORY:  Positive for cancer.   SOCIAL HISTORY:  The patient is married, lives in one-level home with  one-step entry.  Wife is legally blind and needs assist.  The patient  was independent and driving prior to admission.  He does not use any  tobacco, drinks 2 glasses of wine a day.   HOSPITAL COURSE:  Darren Rodriguez was admitted to rehab on November 14, 2007, for inpatient therapies to consist of PT/OT daily.  On past  admission, the patient was noted to have voiding problems requiring in-  and-out catheter for volumes up to 500-700 mL.  A UA/UC was sent off and  the patient was started on Urecholine to help with bladder functioning.  Labs done on past admission revealed hemoglobin of 11.1, hematocrit  31.4, white count 5.4, and platelets 174.  Check of electrolytes  revealed sodium 137, potassium 4.1, chloride 102, CO2 29, BUN 13,  creatinine 0.85, and glucose 99.  LFTs showed SGOT 23, SGPT 22, alkaline  phosphate 73, and total bilirubin 0.3.  Dilantin level was normal at  9.4.  The patient's urine culture showed  no growth.  As the patient's  voiding improved, he has been tapered off Urecholine.  The patient's  blood pressures have been monitored on b.i.d. basis.  He was noted to  have some occasional drop in BP in evening.  Blood pressures at the time  of discharge ranging from 120s-130s systolics,  60s-70s diastolic.  The  patient is continent of bowel and bladder.  The patient has had  complaints of left knee pain.  X-rays of left knee show diffuse soft  tissue swelling and degenerative joint disease.  The patient was started  on colchicine trial as a question of gout is cause of symptoms.  Uric  acid levels were noted to be low at 6.2 and colchicine was discontinued  on November 20, 2007.   Speech therapy evaluated the patient on past admission.  The patient's  cognition was at baseline with no major deficits noted.  Speech therapy  was not indicated during  the stay.  OT has been working with the patient  on balance, endurance as well as energy-conservation measures.  The  patient is overall modified independence for bathing and dressing.  He  is able to use safety precautions with occasional supervision.  The  patient is modified independent for ambulating household distances with  use of rolling walker.  He has supervision for car transfer and for  management of stairs.  The patient will continue to receive followup  home health, PT/OT by advanced home care past discharge.  On November 21, 2007, the patient was discharged to home.   DISCHARGE MEDICATIONS:  1. Flomax 0.4 mg p.o. q.a.m.  2. Lasix 40 mg a day.  3. Diovan 320 mg q.p.m.  4. K-Dur 20 mEq a day.  5. Hytrin 2 mg 3 p.o. q.a.m.  6. Zocor 20 mg a day.  7. Dilantin 100 mg p.o. q.8 h.  8. Lisinopril 30 mg p.o. b.i.d.  9. Omeprazole 20 mg a day.  10.Norvasc 5 mg p.o. per day.  11.Sportscreme to knees b.i.d.  12.Tylenol as needed for pain.  13.Oxycodone IR 5 mg 1-2 q.6-8 h. p.r.n. severe pain #30 Rx.   DIET:  Heart-healthy.   Wound care:  Keep the area clean and dry.   Special instruction is not to use aspirin, advanced home care to provide  PT/OT.  Activity is a 24-hour supervision.  No strenuous activity.  No  alcohol, no smoking, no driving.  Walk with walker.   FOLLOWUP:  The patient to follow up with Dr. Thomasena Edis on January 03, 2008,  at 11 a.m.  Follow up with Dr. Channing Mutters in 2-3 weeks.  Follow up with [facility] or [facility] for routine medical check in 2-3 weeks.      Greg Cutter, P.A.     PP/MEDQ  D:  11/21/2007  T:  11/22/2007  Job:  [id number]   cc:   [facility]  Dr. Channing Mutters

## 2010-12-11 NOTE — Assessment & Plan Note (Signed)
[facility]                                   ON-CALL NOTE   Darren Rodriguez, Darren Rodriguez                      MRN:          [id number]  DATE:02/25/2006                            DOB:          1923/08/18    This is a follow-up from phone conversation with the patient earlier this  evening.   Darren Rodriguez has completed his gastrograph and swallow followed by Barium  swallow.  This was negative for perforation.  The patient was advised with  regards to modified diet, antacids, and mild analgesics.  He will follow up  with Dr. Jarold Motto as previously planned.                                   Wilhemina Bonito. Eda Keys., MD   JNP/MedQ  DD:  02/25/2006  DT:  02/26/2006  Job #:  [id number]   cc:   Vania Rea. Jarold Motto, MD, [place], FACP  Iva Boop, MD, [facility]

## 2010-12-11 NOTE — Assessment & Plan Note (Signed)
[facility]                                   ON-CALL NOTE   Darren Rodriguez, Darren Rodriguez                      MRN:          [id number]  DATE:02/25/2006                            DOB:          [birth year]    TIME OF THE CALL:  8:25 p.m.   REASON FOR CALL:  Esophageal pain.   HISTORY OF PRESENT ILLNESS:  Darren Rodriguez underwent upper endoscopy with Dr.  Jarold Motto this afternoon for complaints of reflux and dysphagia.  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  The patient called this evening complaining of substernal discomfort.  Discomfort has slightly increased since his procedure.  It is slightly worse  with meals.  It sounds moderate in degree.  No pain with inspiration or  fever.  The patient sounds comfortable on the telephone.   DISPOSITION/RECOMMENDATIONS:  I told the patient that I could not  differentiate between some benign soreness due to the dilation versus injury  such as perforation.  As such, I recommended that he go to the hospital for  a Gastrografin swallow.  I spoke to the radiologist, Dr. Bradly Chris, at [facility] as well as the radiology technician.  The patient will  register through the emergency room.  I spoke with the head nurse, Thayer Ohm.  From there, he will go to radiology for the exam.  I will be contacted  thereafter by the radiologist with the results and further disposition.                                   Wilhemina Bonito. Eda Keys., MD   JNP/MedQ  DD:  02/25/2006  DT:  02/26/2006  Job #:  [id number]   cc:   Vania Rea. Jarold Motto, MD, [place], FACP  Iva Boop, MD, [facility]

## 2011-02-22 ENCOUNTER — Telehealth: Payer: Self-pay | Admitting: *Deleted

## 2011-02-22 NOTE — Telephone Encounter (Signed)
Spoke to patients wife and she will advise pt to call back to schedule an office visit due to age, he is due for colonoscopy.

## 2011-04-20 LAB — COMPREHENSIVE METABOLIC PANEL
ALT: 15
ALT: 22
AST: 20
AST: 23
Albumin: 2.6 — ABNORMAL LOW
Albumin: 3.8
CO2: 25
Calcium: 9.1
Calcium: 9.6
Chloride: 104
Creatinine, Ser: 0.95
GFR calc Af Amer: >60
GFR calc Af Amer: >60
GFR calc non Af Amer: >60
Glucose, Bld: 99
Potassium: 4.1
Sodium: 137
Sodium: 139
Total Bilirubin: 0.5
Total Protein: 5.7 — ABNORMAL LOW

## 2011-04-20 LAB — CBC
HCT: 37.7 — ABNORMAL LOW
Hemoglobin: 12.8 — ABNORMAL LOW
MCHC: 34
MCHC: 35.3
MCV: 92.8
Platelets: 162
Platelets: 174
RDW: 13.9
RDW: 13.9

## 2011-04-20 LAB — BASIC METABOLIC PANEL
CO2: 31
Chloride: 100
Creatinine, Ser: 0.86
GFR calc Af Amer: >60
Potassium: 4.1

## 2011-04-20 LAB — DIFFERENTIAL
Eosinophils Absolute: 0.3
Lymphs Abs: 1.1
Monocytes Absolute: 0.5
Monocytes Relative: 10
Neutrophils Relative %: 65

## 2011-04-20 LAB — URINALYSIS, ROUTINE W REFLEX MICROSCOPIC
Glucose, UA: NEGATIVE
Hgb urine dipstick: NEGATIVE
pH: 6.5

## 2011-04-20 LAB — URIC ACID: Uric Acid, Serum: 6.2

## 2011-04-20 LAB — URINE CULTURE
Colony Count: NO GROWTH
Culture: NO GROWTH

## 2011-04-20 LAB — APTT: aPTT: 25

## 2012-07-14 ENCOUNTER — Emergency Department (HOSPITAL_COMMUNITY): Payer: Medicare Other

## 2012-07-14 ENCOUNTER — Encounter (HOSPITAL_COMMUNITY): Payer: Self-pay | Admitting: Adult Health

## 2012-07-14 ENCOUNTER — Inpatient Hospital Stay (HOSPITAL_COMMUNITY)
Admission: EM | Admit: 2012-07-14 | Discharge: 2012-07-18 | DRG: 244 | Disposition: A | Discharge status: Home Health | Payer: Medicare Other | Attending: Cardiovascular Disease | Admitting: Cardiovascular Disease

## 2012-07-14 DIAGNOSIS — S065XAA Traumatic subdural hemorrhage with loss of consciousness status unknown, initial encounter: Secondary | ICD-10-CM | POA: Diagnosis not present

## 2012-07-14 DIAGNOSIS — R296 Repeated falls: Secondary | ICD-10-CM

## 2012-07-14 DIAGNOSIS — S065X9A Traumatic subdural hemorrhage with loss of consciousness of unspecified duration, initial encounter: Secondary | ICD-10-CM | POA: Diagnosis not present

## 2012-07-14 DIAGNOSIS — R5381 Other malaise: Secondary | ICD-10-CM | POA: Diagnosis present

## 2012-07-14 DIAGNOSIS — I442 Atrioventricular block, complete: Principal | ICD-10-CM | POA: Diagnosis not present

## 2012-07-14 DIAGNOSIS — Z79899 Other long term (current) drug therapy: Secondary | ICD-10-CM

## 2012-07-14 DIAGNOSIS — Z8546 Personal history of malignant neoplasm of prostate: Secondary | ICD-10-CM

## 2012-07-14 DIAGNOSIS — E876 Hypokalemia: Secondary | ICD-10-CM | POA: Diagnosis present

## 2012-07-14 DIAGNOSIS — R55 Syncope and collapse: Secondary | ICD-10-CM

## 2012-07-14 DIAGNOSIS — Z9181 History of falling: Secondary | ICD-10-CM

## 2012-07-14 DIAGNOSIS — I10 Essential (primary) hypertension: Secondary | ICD-10-CM | POA: Diagnosis present

## 2012-07-14 DIAGNOSIS — I2510 Atherosclerotic heart disease of native coronary artery without angina pectoris: Secondary | ICD-10-CM | POA: Diagnosis present

## 2012-07-14 DIAGNOSIS — I495 Sick sinus syndrome: Secondary | ICD-10-CM | POA: Diagnosis present

## 2012-07-14 DIAGNOSIS — Z7982 Long term (current) use of aspirin: Secondary | ICD-10-CM

## 2012-07-14 DIAGNOSIS — R42 Dizziness and giddiness: Secondary | ICD-10-CM | POA: Diagnosis present

## 2012-07-14 DIAGNOSIS — I872 Venous insufficiency (chronic) (peripheral): Secondary | ICD-10-CM | POA: Diagnosis present

## 2012-07-14 DIAGNOSIS — I441 Atrioventricular block, second degree: Secondary | ICD-10-CM | POA: Diagnosis present

## 2012-07-14 HISTORY — DX: Malignant neoplasm of prostate: C61

## 2012-07-14 HISTORY — DX: Traumatic subdural hemorrhage with loss of consciousness of unspecified duration, initial encounter: S06.5X9A

## 2012-07-14 HISTORY — DX: Essential (primary) hypertension: I10

## 2012-07-14 HISTORY — DX: Localized edema: R60.0

## 2012-07-14 LAB — BASIC METABOLIC PANEL
BUN: 28 mg/dL — ABNORMAL HIGH (ref 6–23)
Chloride: 101 mEq/L (ref 96–112)
Creatinine, Ser: 1.05 mg/dL (ref 0.50–1.35)
Glucose, Bld: 102 mg/dL — ABNORMAL HIGH (ref 70–99)
Potassium: 3.4 mEq/L — ABNORMAL LOW (ref 3.5–5.1)

## 2012-07-14 LAB — CBC
HCT: 35.9 % — ABNORMAL LOW (ref 39.0–52.0)
Hemoglobin: 11.7 g/dL — ABNORMAL LOW (ref 13.0–17.0)
MCHC: 32.6 g/dL (ref 30.0–36.0)
MCV: 89.8 fL (ref 78.0–100.0)

## 2012-07-14 LAB — URINALYSIS, ROUTINE W REFLEX MICROSCOPIC
Bilirubin Urine: NEGATIVE
Glucose, UA: NEGATIVE mg/dL
Ketones, ur: NEGATIVE mg/dL
Leukocytes, UA: NEGATIVE
Protein, ur: NEGATIVE mg/dL

## 2012-07-14 MED ORDER — SODIUM CHLORIDE 0.9 % IV BOLUS (SEPSIS)
500.0000 mL | Freq: Once | INTRAVENOUS | Status: AC
  Administered 2012-07-14: 500 mL via INTRAVENOUS

## 2012-07-14 NOTE — ED Notes (Signed)
Hospitalist at bedside 

## 2012-07-14 NOTE — ED Notes (Signed)
Patient transported to X-ray 

## 2012-07-14 NOTE — ED Notes (Signed)
Presents from [facility], pt states, "yestereday I started feeling strange after I woke up. I had a sensation of weakness that came over me" The facility stated he had a 10 minute episode of loss of consciousness. Pt does not recall syncopal episode. He is alert and answering all questions appropriately, no droop or drift. Denies pain. Pt reports multiple falls. 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 Facility reports intermittent episodes of disorientation and poor decision making. Concerned that he is not well and living unsafely in apartment.  Pt reports no pain prior to "fainting episode" states he just became suddenly weak.

## 2012-07-14 NOTE — ED Provider Notes (Signed)
History     CSN: [id number]  Arrival date & time 07/14/12  0102   First MD Initiated Contact with Patient 07/14/12 2102      Chief Complaint  Patient presents with  . Loss of Consciousness    (Consider location/radiation/quality/duration/timing/severity/associated sxs/prior treatment) HPI Comments: Patient is a poor historian presents from retirement facility with frequent falls and possible syncopal episodes. Patient reports he had a mechanical trip and fall one week ago injured his left knee. Since then his family reports she's had multiple falls and seems like use having syncopal episodes and she does not recall these episodes. He denies any chest pain, shortness of breath, nausea or vomiting. He denies any prodrome of a dizziness, lightheadedness, sweating or blurry vision. He denies any pain anywhere except his knee. He reports good by mouth intake and urine output. He is able to walk without assistance.  The history is provided by the patient.    Past Medical History  Diagnosis Date  . Hypertension   . CAD (coronary artery disease)     Past Surgical History  Procedure Date  . Brain surgery     Family History  Problem Relation Age of Onset  . Heart disease Mother   . Hypertension Mother   . Cancer Father     History  Substance Use Topics  . Smoking status: Never Smoker   . Smokeless tobacco: Not on file  . Alcohol Use: Yes     Comment: 1-2 glasses of wine a day      Review of Systems  Constitutional: Positive for activity change and appetite change. Negative for fever.  HENT: Negative for congestion and rhinorrhea.   Eyes: Negative for visual disturbance.  Respiratory: Negative for cough, chest tightness and shortness of breath.   Cardiovascular: Positive for syncope. Negative for chest pain.  Gastrointestinal: Negative for nausea, vomiting and abdominal pain.  Genitourinary: Negative for dysuria, hematuria and decreased urine volume.  Musculoskeletal:  Negative for back pain.  Skin: Negative for rash and wound.  Neurological: Positive for syncope and weakness. Negative for dizziness, light-headedness and headaches.  Review of Systems  Constitutional: Positive for activity change and appetite change. Negative for fever.  HENT: Negative for congestion and rhinorrhea.   Eyes: Negative for visual disturbance.  Respiratory: Negative for cough, chest tightness and shortness of breath.   Cardiovascular: Positive for syncope. Negative for chest pain.  Gastrointestinal: Negative for nausea, vomiting and abdominal pain.  Genitourinary: Negative for dysuria, hematuria and decreased urine volume.  Musculoskeletal: Negative for back pain.  Skin: Negative for rash and wound.  Neurological: Positive for syncope and weakness. Negative for dizziness, light-headedness and headaches.   A complete 10 system review of systems was obtained and all systems are negative except as noted in the HPI and PMH.   Allergies  Review of patient's allergies indicates no known allergies.  Home Medications   No current outpatient prescriptions on file.  BP 94/54  Pulse 60  Temp 97.8 F (36.6 C) (Oral)  Resp 19  Ht 6\' 2"  (1.88 m)  Wt 172 lb 13.5 oz (78.4 kg)  BMI 22.19 kg/m2  SpO2 98%  Physical Exam  Constitutional: He is oriented to person, place, and time. He appears well-developed and well-nourished. No distress.  HENT:  Head: Normocephalic and atraumatic.  Mouth/Throat: Oropharynx is clear and moist. No oropharyngeal exudate.  Eyes: Conjunctivae normal and EOM are normal. Pupils are equal, round, and reactive to light.  Neck: Normal range of motion. Neck  supple.       No carotid bruits  Cardiovascular: Normal rate, regular rhythm and normal heart sounds.   No murmur heard. Pulmonary/Chest: Effort normal and breath sounds normal. No respiratory distress.  Abdominal: Soft. There is no tenderness. There is no rebound and no guarding.  Musculoskeletal:  Normal range of motion. He exhibits no edema and no tenderness.       Various ecchymosis and  abrasions to upper extremities in various stages of healing  Neurological: He is alert and oriented to person, place, and time. No cranial nerve deficit. He exhibits normal muscle tone. Coordination normal.       Cranial nerves 2-12 intact, 5 out of 5 strength throughout, no ataxia finger to nose, no pronator drift, negative Romberg sign, normal gait  Skin: Skin is warm.    ED Course  Procedures (including critical care time)  Labs Reviewed  CBC - Abnormal; Notable for the following:    RBC 4.00 (*)     Hemoglobin 11.7 (*)     HCT 35.9 (*)     All other components within normal limits  BASIC METABOLIC PANEL - Abnormal; Notable for the following:    Potassium 3.4 (*)     Glucose, Bld 102 (*)     BUN 28 (*)     GFR calc non Af Amer 62 (*)     GFR calc Af Amer 72 (*)     All other components within normal limits  COMPREHENSIVE METABOLIC PANEL - Abnormal; Notable for the following:    Glucose, Bld 120 (*)     Total Protein 5.8 (*)     Albumin 3.0 (*)     Total Bilirubin 0.2 (*)     GFR calc non Af Amer 76 (*)     GFR calc Af Amer 88 (*)     All other components within normal limits  CBC - Abnormal; Notable for the following:    RBC 3.41 (*)     Hemoglobin 10.0 (*)     HCT 30.4 (*)     Platelets 142 (*)     All other components within normal limits  POCT I-STAT TROPONIN I  PROTIME-INR  URINALYSIS, ROUTINE W REFLEX MICROSCOPIC  MAGNESIUM  PHOSPHORUS  TROPONIN I  TROPONIN I  TSH   Dg Chest 2 View  07/14/2012  *RADIOLOGY REPORT*  Clinical Data: Syncope.  CHEST - 2 VIEW  Comparison: 11/08/2007.  Findings: Aortic arch atherosclerosis.  Cardiopericardial silhouette within normal limits. Mediastinal contours normal. Trachea midline.  No airspace disease or effusion. Monitoring leads are projected over the chest.  IMPRESSION: No active cardiopulmonary disease.   Original Report  Authenticated By: Andreas Newport, M.D.    Ct Head Wo Contrast  07/14/2012  *RADIOLOGY REPORT*  Clinical Data: Increasing episodes of falling.  Loss of consciousness.  CT HEAD WITHOUT CONTRAST  Technique:  Contiguous axial images were obtained from the base of the skull through the vertex without contrast.  Comparison: 04/22/2008  Findings: The ventricles are normal in overall configuration. There is ventricular and sulcal enlargement reflecting moderate atrophy.  No convincing hydrocephalus.  There are no parenchymal masses or mass effect.  There is no evidence of a recent infarct.  There is no intracranial hemorrhage.  There are no extra-axial masses or abnormal fluid collections.  There are bilateral frontal and parietal burr holes which is stable presumably for creation of previous subdural collections.  Old nasal fractures.  Mild ethmoid sinus mucosal thickening.  The remaining  visualized sinuses and the mastoid air cells are clear.  IMPRESSION: No acute intracranial abnormalities.  Moderate atrophy.  Changes from previous bilateral frontal and parietal burr holes.   Original Report Authenticated By: Amie Portland, M.D.      1. Syncope   2. Debility, unspecified   3. Episodic lightheadedness   4. HTN (hypertension)   5. Hypokalemia   6. Frequent falls       MDM  Multiple episodes of lightheadedness and near syncope.  Unclear if actually having syncopal episodes. Nonfocal neuro exam.  Gait normal, romberg negative.  EKG without arrhythmia.  Labs unremarkable. CT head and orthostatics negative.  Plan admission for syncope workup given age and risk factors. Would benefit from echo and carotid dopplers, PT eval.       Date: 07/14/2012  Rate: 62  Rhythm: normal sinus rhythm  QRS Axis: normal  Intervals: normal  ST/T Wave abnormalities: normal  Conduction Disutrbances:first-degree A-V block   Narrative Interpretation:   Old EKG Reviewed: none available    Glynn Octave,  MD 07/15/12 1203

## 2012-07-14 NOTE — ED Notes (Addendum)
Pt A.O.x 4. Son in law at bedside reports increases episodes of falls. Most recent yesterday. Unaware of injury to head. Reports patient "was out of it for approx ". Patient denies any knowledge of syncopal episodes. States "I don't remember anything. It's all blacked out". Reports occasional episodes of dizziness. Denies recent vision changes. Denies pain. Has various brusing in different stages on forearms bilaterally. Denies pain. Denies SOB. Denies Chest pain. Denies N/V. Ambulatory with no assist. Gait stable. Son in law reports hxt of brain surgery here at [facility] approx 4 yrs ago. Unaware of specifics.

## 2012-07-14 NOTE — H&P (Signed)
PCP:  [facility]  - Mamoud[facility] Jinger Neighbors   Chief Complaint:   Darren Rodriguez headed  HPI: Darren Rodriguez is a 78 y.o. male   has a past medical history of Hypertension and CAD (coronary artery disease).   Presented with  He has hx of ocasional lightheadedness for years. It occurs randomly sometimes when he stands up or sometimes when he is sitting. He had an other episode few days ago that was worse than usual. He was laying down on the sofa and felt weak all over this seemed to last a  bit longer than usual so his aids have reported this to the nursing staff.  He also noted some swelling in his lower legs for the past 6 months that was new His cardiologist have increased his lasix.  10 days ago he had a mechanical fall denies any head injury. He denies any hx of ever synopsizing.    Review of Systems:     Pertinent positives include: Bilateral lower extremity swelling light headedness.  Constitutional:  No weight loss, night sweats, Fevers, chills, fatigue, weight loss  HEENT:  No headaches, Difficulty swallowing,Tooth/dental problems,Sore throat,  No sneezing, itching, ear ache, nasal congestion, post nasal drip,  Cardio-vascular:  No chest pain, Orthopnea, PND, anasarca, dizziness, palpitations.no  GI:  No heartburn, indigestion, abdominal pain, nausea, vomiting, diarrhea, change in bowel habits, loss of appetite, melena, blood in stool, hematemesis Resp:  no shortness of breath at rest. No dyspnea on exertion, No excess mucus, no productive cough, No non-productive cough, No coughing up of blood.No change in color of mucus.No wheezing. Skin:  no rash or lesions. No jaundice GU:  no dysuria, change in color of urine, no urgency or frequency. No straining to urinate.  No flank pain.  Musculoskeletal:  No joint pain or no joint swelling. No decreased range of motion. No back pain.  Psych:  No change in mood or affect. No depression or anxiety. No memory loss.  Neuro: no  localizing neurological complaints, no tingling, no weakness, no double vision, no gait abnormality, no slurred speech, no confusion  Otherwise ROS are negative except for above, 10 systems were reviewed  Past Medical History: Past Medical History  Diagnosis Date  . Hypertension   . CAD (coronary artery disease)    Past Surgical History  Procedure Date  . Brain surgery      Medications: Prior to Admission medications   Medication Sig Start Date End Date Taking? Authorizing Provider  aspirin EC 81 MG tablet Take 162 mg by mouth daily.   Yes Historical Provider, MD  colchicine 0.6 MG tablet Take 0.6 mg by mouth daily.   Yes Historical Provider, MD  cyanocobalamin (,VITAMIN B-12,) 1000 MCG/ML injection Inject 1,000 mcg into the muscle every 30 (thirty) days. Takes on last week of month   Yes Historical Provider, MD  furosemide (LASIX) 40 MG tablet Take 60 mg by mouth daily.   Yes Historical Provider, MD  lisinopril (PRINIVIL,ZESTRIL) 20 MG tablet Take 20 mg by mouth 2 (two) times daily.   Yes Historical Provider, MD  lovastatin (MEVACOR) 40 MG tablet Take 40 mg by mouth daily.   Yes Historical Provider, MD  metolazone (ZAROXOLYN) 2.5 MG tablet Take 2.5 mg by mouth once a week.   Yes Historical Provider, MD  omeprazole (PRILOSEC) 20 MG capsule Take 20 mg by mouth daily.   Yes Historical Provider, MD  potassium chloride SA (K-DUR,KLOR-CON) 20 MEQ tablet Take 20 mEq by mouth daily.  Yes Historical Provider, MD  terazosin (HYTRIN) 2 MG capsule Take 2 mg by mouth 2 (two) times daily.   Yes Historical Provider, MD  Vitamin D, Ergocalciferol, (DRISDOL) 50000 UNITS CAPS Take 50,000 Units by mouth every 7 (seven) days. Takes on saturdays   Yes Historical Provider, MD    Allergies:  No Known Allergies  Social History:  Ambulatory   independently   Lives [place]   reports that he has never smoked. He does not have any smokeless tobacco history on file. He reports that he drinks  alcohol. He reports that he does not use illicit drugs.   Family History: family history includes Cancer in his father; Heart disease in his mother; and Hypertension in his mother.    Physical Exam: Patient Vitals for the past 24 hrs:  BP Temp Temp src Pulse Resp SpO2  07/14/12 2316 168/68 mmHg - - 70  - -  07/14/12 2314 167/71 mmHg - - 57  - -  07/14/12 2312 170/85 mmHg - - 58  - -  07/14/12 2046 167/66 mmHg 97.8 F (36.6 C) - 63  10  99 %  07/14/12 1913 147/68 mmHg 97.6 F (36.4 C) Oral 68  18  98 %    1. General:  in No Acute distress 2. Psychological: Alert and Oriented to self but quite the situation.  3. Head/ENT:   Moist   Mucous Membranes                          Head Non traumatic, neck supple                          Normal   Dentition 4. SKIN: normal  Skin turgor,  Skin clean Dry and intact no rash 5. Heart: Regular rate and rhythm no Murmur, Rub or gallop 6. Lungs: Clear to auscultation bilaterally, no wheezes or crackles   7. Abdomen: Soft, non-tender, Non distended 8. Lower extremities: no clubbing, cyanosis, 2+ edema bilaterally 9. Neurologically Grossly intact, moving all 4 extremities equally 10. MSK: Normal range of motion  body mass index is unknown because there is no height or weight on file.   Labs on Admission:   [facility] 07/14/12 1945  NA 140  K 3.4*  CL 101  CO2 30  GLUCOSE 102*  BUN 28*  CREATININE 1.05  CALCIUM 9.9  MG --  PHOS --   No results found for this basename: AST:2,ALT:2,ALKPHOS:2,BILITOT:2,PROT:2,ALBUMIN:2 in the last 72 hours No results found for this basename: LIPASE:2,AMYLASE:2 in the last 72 hours  Basename 07/14/12 1945  WBC 4.7  NEUTROABS --  HGB 11.7*  HCT 35.9*  MCV 89.8  PLT 151   No results found for this basename: CKTOTAL:3,CKMB:3,CKMBINDEX:3,TROPONINI:3 in the last 72 hours No results found for this basename: TSH,T4TOTAL,FREET3,T3FREE,THYROIDAB in the last 72 hours No results found for this basename:  VITAMINB12:2,FOLATE:2,FERRITIN:2,TIBC:2,IRON:2,RETICCTPCT:2 in the last 72 hours No results found for this basename: HGBA1C    CrCl is unknown because there is no height on file for the current visit. ABG No results found for this basename: phart, pco2, po2, hco3, tco2, acidbasedef, o2sat     No results found for this basename: DDIMER     Other results:  I have pearsonaly reviewed this: ECG REPORT  Rate:62   Rhythm: 1 st degree AV block ST&T Change: No ischemic changes  UA no evidence of infection    Cultures:  Component Value Date/Time   SDES URINE, CLEAN CATCH 11/14/2007 1822   SPECREQUEST NONE 11/14/2007 1822   CULT NO GROWTH 11/14/2007 1822   REPTSTATUS 11/16/2007 FINAL 11/14/2007 1822       Radiological Exams on Admission: Dg Chest 2 View  07/14/2012  *RADIOLOGY REPORT*  Clinical Data: Syncope.  CHEST - 2 VIEW  Comparison: 11/08/2007.  Findings: Aortic arch atherosclerosis.  Cardiopericardial silhouette within normal limits. Mediastinal contours normal. Trachea midline.  No airspace disease or effusion. Monitoring leads are projected over the chest.  IMPRESSION: No active cardiopulmonary disease.   Original Report Authenticated By: Andreas Newport, M.D.    Ct Head Wo Contrast  07/14/2012  *RADIOLOGY REPORT*  Clinical Data: Increasing episodes of falling.  Loss of consciousness.  CT HEAD WITHOUT CONTRAST  Technique:  Contiguous axial images were obtained from the base of the skull through the vertex without contrast.  Comparison: 04/22/2008  Findings: The ventricles are normal in overall configuration. There is ventricular and sulcal enlargement reflecting moderate atrophy.  No convincing hydrocephalus.  There are no parenchymal masses or mass effect.  There is no evidence of a recent infarct.  There is no intracranial hemorrhage.  There are no extra-axial masses or abnormal fluid collections.  There are bilateral frontal and parietal burr holes which is stable presumably for  creation of previous subdural collections.  Old nasal fractures.  Mild ethmoid sinus mucosal thickening.  The remaining visualized sinuses and the mastoid air cells are clear.  IMPRESSION: No acute intracranial abnormalities.  Moderate atrophy.  Changes from previous bilateral frontal and parietal burr holes.   Original Report Authenticated By: Amie Portland, M.D.     Chart has been reviewed  Assessment/Plan  78 years old gentleman here with episodic lightheadedness which became worse in his baseline no true syncope.  Present on Admission:  . Episodic lightheadedness - etiology is unclear but will obtain echogram, cycle cardiac markers, and admit on telemetry also rectal carotid Dopplers. Patient is elderly and has a lot of risk factors.  Marland Kitchen HTN (hypertension) - continue home medications  . Hypokalemia - will replace  . Debility, unspecified - has PT OT evaluation.   Prophylaxis: SCD   Protonix  CODE STATUS: FULL CODE per patient  Other plan as per orders.  I have spent a total of 55 min on this admission  Alleya Demeter 07/14/2012, 11:42 PM

## 2012-07-15 ENCOUNTER — Encounter (HOSPITAL_COMMUNITY): Payer: Self-pay | Admitting: Cardiology

## 2012-07-15 DIAGNOSIS — S065X9A Traumatic subdural hemorrhage with loss of consciousness of unspecified duration, initial encounter: Secondary | ICD-10-CM | POA: Diagnosis not present

## 2012-07-15 DIAGNOSIS — E876 Hypokalemia: Secondary | ICD-10-CM | POA: Diagnosis present

## 2012-07-15 DIAGNOSIS — R42 Dizziness and giddiness: Secondary | ICD-10-CM

## 2012-07-15 DIAGNOSIS — I442 Atrioventricular block, complete: Secondary | ICD-10-CM | POA: Diagnosis not present

## 2012-07-15 DIAGNOSIS — I10 Essential (primary) hypertension: Secondary | ICD-10-CM

## 2012-07-15 DIAGNOSIS — R296 Repeated falls: Secondary | ICD-10-CM

## 2012-07-15 DIAGNOSIS — R5381 Other malaise: Secondary | ICD-10-CM | POA: Diagnosis present

## 2012-07-15 LAB — COMPREHENSIVE METABOLIC PANEL
ALT: 10 U/L (ref 0–53)
BUN: 23 mg/dL (ref 6–23)
CO2: 26 mEq/L (ref 19–32)
Calcium: 9.1 mg/dL (ref 8.4–10.5)
Creatinine, Ser: 0.86 mg/dL (ref 0.50–1.35)
GFR calc Af Amer: 88 mL/min — ABNORMAL LOW (ref >90)
GFR calc non Af Amer: 76 mL/min — ABNORMAL LOW (ref >90)
Glucose, Bld: 120 mg/dL — ABNORMAL HIGH (ref 70–99)
Sodium: 141 mEq/L (ref 135–145)
Total Protein: 5.8 g/dL — ABNORMAL LOW (ref 6.0–8.3)

## 2012-07-15 LAB — CBC
HCT: 30.4 % — ABNORMAL LOW (ref 39.0–52.0)
Hemoglobin: 10 g/dL — ABNORMAL LOW (ref 13.0–17.0)
MCHC: 32.9 g/dL (ref 30.0–36.0)
RBC: 3.41 MIL/uL — ABNORMAL LOW (ref 4.22–5.81)
WBC: 4.8 10*3/uL (ref 4.0–10.5)

## 2012-07-15 LAB — TROPONIN I
Troponin I: <0.3 ng/mL (ref <0.30)
Troponin I: <0.3 ng/mL (ref <0.30)

## 2012-07-15 LAB — MAGNESIUM: Magnesium: 2 mg/dL (ref 1.5–2.5)

## 2012-07-15 LAB — MRSA PCR SCREENING: MRSA by PCR: NEGATIVE

## 2012-07-15 MED ORDER — SODIUM CHLORIDE 0.9 % IJ SOLN: 3.0000 mL | Freq: Two times a day (BID) | INTRAMUSCULAR | Status: DC

## 2012-07-15 MED ORDER — SODIUM CHLORIDE 0.9 % IJ SOLN: 3.0000 mL | INTRAMUSCULAR | Status: DC | PRN

## 2012-07-15 MED ORDER — DIPHENHYDRAMINE HCL 25 MG PO CAPS
25.0000 mg | ORAL_CAPSULE | Freq: Every evening | ORAL | Status: DC | PRN
  Administered 2012-07-15 – 2012-07-17 (×4): 25 mg via ORAL
  Filled 2012-07-15 (×4): qty 1

## 2012-07-15 MED ORDER — ACETAMINOPHEN 325 MG PO TABS
650.0000 mg | ORAL_TABLET | Freq: Four times a day (QID) | ORAL | Status: DC | PRN
  Administered 2012-07-15: 650 mg via ORAL
  Filled 2012-07-15: qty 2

## 2012-07-15 MED ORDER — SODIUM CHLORIDE 0.9 % IJ SOLN
3.0000 mL | Freq: Two times a day (BID) | INTRAMUSCULAR | Status: DC
  Administered 2012-07-15 (×2): 3 mL via INTRAVENOUS

## 2012-07-15 MED ORDER — TERAZOSIN HCL 2 MG PO CAPS
2.0000 mg | ORAL_CAPSULE | Freq: Two times a day (BID) | ORAL | Status: DC
Start: 2012-07-15 — End: 2012-07-15
  Administered 2012-07-15 (×2): 2 mg via ORAL
  Filled 2012-07-15 (×3): qty 1

## 2012-07-15 MED ORDER — DOCUSATE SODIUM 100 MG PO CAPS
100.0000 mg | ORAL_CAPSULE | Freq: Two times a day (BID) | ORAL | Status: DC
  Administered 2012-07-15 – 2012-07-18 (×6): 100 mg via ORAL
  Filled 2012-07-15 (×8): qty 1

## 2012-07-15 MED ORDER — ONDANSETRON HCL 4 MG/2ML IJ SOLN: 4.0000 mg | Freq: Three times a day (TID) | INTRAMUSCULAR | Status: DC | PRN

## 2012-07-15 MED ORDER — SIMVASTATIN 20 MG PO TABS
20.0000 mg | ORAL_TABLET | Freq: Every day | ORAL | Status: DC
  Administered 2012-07-15 – 2012-07-17 (×3): 20 mg via ORAL
  Filled 2012-07-15 (×5): qty 1

## 2012-07-15 MED ORDER — ONDANSETRON HCL 4 MG PO TABS: 4.0000 mg | ORAL_TABLET | Freq: Four times a day (QID) | ORAL | Status: DC | PRN

## 2012-07-15 MED ORDER — ASPIRIN EC 81 MG PO TBEC
162.0000 mg | DELAYED_RELEASE_TABLET | Freq: Every day | ORAL | Status: DC
  Administered 2012-07-15 – 2012-07-16 (×2): 162 mg via ORAL
  Administered 2012-07-17: 81 mg via ORAL
  Administered 2012-07-18: 162 mg via ORAL
  Filled 2012-07-15 (×4): qty 2

## 2012-07-15 MED ORDER — HALOPERIDOL LACTATE 5 MG/ML IJ SOLN: 0.5000 mg | Freq: Four times a day (QID) | INTRAMUSCULAR | Status: DC | PRN

## 2012-07-15 MED ORDER — ONDANSETRON HCL 4 MG/2ML IJ SOLN: 4.0000 mg | Freq: Four times a day (QID) | INTRAMUSCULAR | Status: DC | PRN

## 2012-07-15 MED ORDER — DOPAMINE-DEXTROSE 3.2-5 MG/ML-% IV SOLN
2.5000 ug/kg/min | INTRAVENOUS | Status: DC
  Administered 2012-07-16 (×2): 6 ug/kg/min via INTRAVENOUS
  Filled 2012-07-15: qty 250

## 2012-07-15 MED ORDER — FUROSEMIDE 40 MG PO TABS
60.0000 mg | ORAL_TABLET | Freq: Every day | ORAL | Status: AC
  Administered 2012-07-15 – 2012-07-16 (×2): 60 mg via ORAL
  Filled 2012-07-15 (×2): qty 1

## 2012-07-15 MED ORDER — LISINOPRIL 20 MG PO TABS
20.0000 mg | ORAL_TABLET | Freq: Two times a day (BID) | ORAL | Status: DC
  Administered 2012-07-15 – 2012-07-18 (×8): 20 mg via ORAL
  Filled 2012-07-15 (×9): qty 1

## 2012-07-15 MED ORDER — HYDROCODONE-ACETAMINOPHEN 5-325 MG PO TABS
1.0000 | ORAL_TABLET | ORAL | Status: DC | PRN
  Administered 2012-07-18: 2 via ORAL
  Filled 2012-07-15: qty 2

## 2012-07-15 MED ORDER — POTASSIUM CHLORIDE CRYS ER 20 MEQ PO TBCR
20.0000 meq | EXTENDED_RELEASE_TABLET | Freq: Once | ORAL | Status: AC
  Administered 2012-07-15: 20 meq via ORAL
  Filled 2012-07-15: qty 1

## 2012-07-15 MED ORDER — SODIUM CHLORIDE 0.9 % IV SOLN
250.0000 mL | INTRAVENOUS | Status: DC | PRN
  Administered 2012-07-15: 500 mL via INTRAVENOUS

## 2012-07-15 MED ORDER — ACETAMINOPHEN 650 MG RE SUPP: 650.0000 mg | Freq: Four times a day (QID) | RECTAL | Status: DC | PRN

## 2012-07-15 MED ORDER — COLCHICINE 0.6 MG PO TABS
0.6000 mg | ORAL_TABLET | Freq: Every day | ORAL | Status: DC
  Administered 2012-07-15 – 2012-07-18 (×4): 0.6 mg via ORAL
  Filled 2012-07-15 (×4): qty 1

## 2012-07-15 MED ORDER — TERAZOSIN HCL 2 MG PO CAPS
2.0000 mg | ORAL_CAPSULE | Freq: Every day | ORAL | Status: DC
  Administered 2012-07-16 – 2012-07-17 (×2): 2 mg via ORAL
  Filled 2012-07-15 (×3): qty 1

## 2012-07-15 MED ORDER — POTASSIUM CHLORIDE CRYS ER 20 MEQ PO TBCR
20.0000 meq | EXTENDED_RELEASE_TABLET | Freq: Every day | ORAL | Status: DC
  Administered 2012-07-15 – 2012-07-18 (×4): 20 meq via ORAL
  Filled 2012-07-15 (×5): qty 1

## 2012-07-15 MED ORDER — PANTOPRAZOLE SODIUM 40 MG PO TBEC
40.0000 mg | DELAYED_RELEASE_TABLET | Freq: Every day | ORAL | Status: DC
  Administered 2012-07-15 – 2012-07-18 (×3): 40 mg via ORAL
  Filled 2012-07-15 (×3): qty 1

## 2012-07-15 MED ORDER — ENOXAPARIN SODIUM 40 MG/0.4ML ~~LOC~~ SOLN
40.0000 mg | SUBCUTANEOUS | Status: AC
  Administered 2012-07-16: 40 mg via SUBCUTANEOUS
  Filled 2012-07-15 (×2): qty 0.4

## 2012-07-15 MED ORDER — DOPAMINE-DEXTROSE 3.2-5 MG/ML-% IV SOLN
INTRAVENOUS | Status: AC
  Administered 2012-07-15: 800000 ug
  Filled 2012-07-15: qty 250

## 2012-07-15 NOTE — Consult Note (Signed)
Reason for Consult: AVB  Requesting Physician: [facility]  HPI: This is a 78 y.o.married  male, father of two, grandfather of 4 followed by [place] long term for hypertension and significant venous insufficiency with edema of his legs. Venous studies in June of 2013 showed right and left greater saphenous veins with severe valvular insufficiency and no significant insufficiency of the lesser saphenous veins. It was felt he was probably a candidate for endovenous ablation, and he was referred him to Dr. Rennis Golden. He actually had venous insufficiency in the deep and superficial veins and it was not recommended that he have endovenous ablation since ablation of his superficial veins would be unlikely to solve his edema problem. He has not had any chest pain, not suspected of having coronary disease. His activity is limited. He lives with his wife at [employer]. He has never required catheterization, and he has had a negative Myoview for ischemia 4/11. LV function was normal on 2D echo of 5/13 with no significant valve disease. He has had 1st degree AVB on prior EKGs but no history of PAF or arrythmia. He was admitted 07/14/12 by his family because of "weak spells". He denies any true syncope though he seems slightly confused and is somewhat of a rambling historian. He was scheduled for discharge today when he was noted to have AVB on telemetry with VR high 30s. We are asked to see him now in consult. Review of his telemetry shows intermittent CHB.   CURRENT MEDICATIONS:     PMHx:  Past Medical History  Diagnosis Date  . Hypertension   . Edema extremities     Chronic  . SDH (subdural hematoma) 2009  . Prostate cancer    Past Surgical History  Procedure Date  . Brain surgery     FAMHx: Family History  Problem Relation Age of Onset  . Heart disease Mother   . Hypertension Mother   . Cancer Father     SOCHx:  reports that he has never smoked. He does not have any smokeless tobacco history on  file. He reports that he drinks alcohol. He reports that he does not use illicit drugs.  ALLERGIES: No Known Allergies  ROS: Pertinent items are noted in HPI.  HOME MEDICATIONS: Prescriptions prior to admission  Medication Sig Dispense Refill  . aspirin EC 81 MG tablet Take 162 mg by mouth daily.      . colchicine 0.6 MG tablet Take 0.6 mg by mouth daily.      . cyanocobalamin (,VITAMIN B-12,) 1000 MCG/ML injection Inject 1,000 mcg into the muscle every 30 (thirty) days. Takes on last week of month      . furosemide (LASIX) 40 MG tablet Take 60 mg by mouth daily.      Marland Kitchen lisinopril (PRINIVIL,ZESTRIL) 20 MG tablet Take 20 mg by mouth 2 (two) times daily.      Marland Kitchen lovastatin (MEVACOR) 40 MG tablet Take 40 mg by mouth daily.      Marland Kitchen omeprazole (PRILOSEC) 20 MG capsule Take 20 mg by mouth daily.      . potassium chloride SA (K-DUR,KLOR-CON) 20 MEQ tablet Take 20 mEq by mouth daily.      Marland Kitchen terazosin (HYTRIN) 2 MG capsule Take 2 mg by mouth 2 (two) times daily.      . Vitamin D, Ergocalciferol, (DRISDOL) 50000 UNITS CAPS Take 50,000 Units by mouth every 7 (seven) days. Takes on saturdays      . [DISCONTINUED] metolazone (ZAROXOLYN) 2.5 MG tablet Take  2.5 mg by mouth once a week.        HOSPITAL MEDICATIONS: I have reviewed the patient's current medications.  VITALS: Blood pressure 94/54, pulse 60, temperature 97.8 F (36.6 C), temperature source Oral, resp. rate 19, height 6\' 2"  (1.88 m), weight 78.4 kg (172 lb 13.5 oz), SpO2 98.00%.  PHYSICAL EXAM: General appearance: alert, cooperative and no distress Neck: no carotid bruit and no JVD Lungs: clear to auscultation bilaterally Heart: regular rate and rhythm Abdomen: soft, non-tender; bowel sounds normal; no masses,  no organomegaly Extremities: chronic venous changes in his LE, 1+ edema Pulses: 2+ and symmetric Skin: Skin color, texture, turgor normal. No rashes or lesions Neurologic: Grossly normal  LABS: Results for orders placed  during the hospital encounter of 07/14/12 (from the past 48 hour(s))  CBC     Status: Abnormal   Collection Time   07/14/12  7:45 PM      Component Value Range Comment   WBC 4.7  4.0 - 10.5 K/uL    RBC 4.00 (*) 4.22 - 5.81 MIL/uL    Hemoglobin 11.7 (*) 13.0 - 17.0 g/dL    HCT 62.1 (*) 30.8 - 52.0 %    MCV 89.8  78.0 - 100.0 fL    MCH 29.3  26.0 - 34.0 pg    MCHC 32.6  30.0 - 36.0 g/dL    RDW 65.7  84.6 - 96.2 %    Platelets 151  150 - 400 K/uL   BASIC METABOLIC PANEL     Status: Abnormal   Collection Time   07/14/12  7:45 PM      Component Value Range Comment   Sodium 140  135 - 145 mEq/L    Potassium 3.4 (*) 3.5 - 5.1 mEq/L    Chloride 101  96 - 112 mEq/L    CO2 30  19 - 32 mEq/L    Glucose, Bld 102 (*) 70 - 99 mg/dL    BUN 28 (*) 6 - 23 mg/dL    Creatinine, Ser 9.52  0.50 - 1.35 mg/dL    Calcium 9.9  8.4 - 84.1 mg/dL    GFR calc non Af Amer 62 (*) >90 mL/min    GFR calc Af Amer 72 (*) >90 mL/min   POCT I-STAT TROPONIN I     Status: Normal   Collection Time   07/14/12  8:19 PM      Component Value Range Comment   Troponin i, poc 0.00  0.00 - 0.08 ng/mL    Comment 3            URINALYSIS, ROUTINE W REFLEX MICROSCOPIC     Status: Normal   Collection Time   07/14/12 11:12 PM      Component Value Range Comment   Color, Urine YELLOW  YELLOW    APPearance CLEAR  CLEAR    Specific Gravity, Urine 1.019  1.005 - 1.030    pH 5.0  5.0 - 8.0    Glucose, UA NEGATIVE  NEGATIVE mg/dL    Hgb urine dipstick NEGATIVE  NEGATIVE    Bilirubin Urine NEGATIVE  NEGATIVE    Ketones, ur NEGATIVE  NEGATIVE mg/dL    Protein, ur NEGATIVE  NEGATIVE mg/dL    Urobilinogen, UA 0.2  0.0 - 1.0 mg/dL    Nitrite NEGATIVE  NEGATIVE    Leukocytes, UA NEGATIVE  NEGATIVE MICROSCOPIC NOT DONE ON URINES WITH NEGATIVE PROTEIN, BLOOD, LEUKOCYTES, NITRITE, OR GLUCOSE <1000 mg/dL.  PROTIME-INR     Status:  Normal   Collection Time   07/14/12 11:20 PM      Component Value Range Comment   Prothrombin Time 12.8   11.6 - 15.2 seconds    INR 0.97  0.00 - 1.49   TROPONIN I     Status: Normal   Collection Time   07/15/12  1:35 AM      Component Value Range Comment   Troponin I <0.30  <0.30 ng/mL   MAGNESIUM     Status: Normal   Collection Time   07/15/12  5:34 AM      Component Value Range Comment   Magnesium 2.0  1.5 - 2.5 mg/dL   PHOSPHORUS     Status: Normal   Collection Time   07/15/12  5:34 AM      Component Value Range Comment   Phosphorus 3.4  2.3 - 4.6 mg/dL   COMPREHENSIVE METABOLIC PANEL     Status: Abnormal   Collection Time   07/15/12  5:34 AM      Component Value Range Comment   Sodium 141  135 - 145 mEq/L    Potassium 3.6  3.5 - 5.1 mEq/L    Chloride 104  96 - 112 mEq/L    CO2 26  19 - 32 mEq/L    Glucose, Bld 120 (*) 70 - 99 mg/dL    BUN 23  6 - 23 mg/dL    Creatinine, Ser 6.57  0.50 - 1.35 mg/dL    Calcium 9.1  8.4 - 84.6 mg/dL    Total Protein 5.8 (*) 6.0 - 8.3 g/dL    Albumin 3.0 (*) 3.5 - 5.2 g/dL    AST 16  0 - 37 U/L    ALT 10  0 - 53 U/L    Alkaline Phosphatase 72  39 - 117 U/L    Total Bilirubin 0.2 (*) 0.3 - 1.2 mg/dL    GFR calc non Af Amer 76 (*) >90 mL/min    GFR calc Af Amer 88 (*) >90 mL/min   CBC     Status: Abnormal   Collection Time   07/15/12  5:34 AM      Component Value Range Comment   WBC 4.8  4.0 - 10.5 K/uL    RBC 3.41 (*) 4.22 - 5.81 MIL/uL    Hemoglobin 10.0 (*) 13.0 - 17.0 g/dL    HCT 96.2 (*) 95.2 - 52.0 %    MCV 89.1  78.0 - 100.0 fL    MCH 29.3  26.0 - 34.0 pg    MCHC 32.9  30.0 - 36.0 g/dL    RDW 84.1  32.4 - 40.1 %    Platelets 142 (*) 150 - 400 K/uL   TROPONIN I     Status: Normal   Collection Time   07/15/12  9:20 AM      Component Value Range Comment   Troponin I <0.30  <0.30 ng/mL     IMAGING: Dg Chest 2 View  07/14/2012  *RADIOLOGY REPORT*  Clinical Data: Syncope.  CHEST - 2 VIEW  Comparison: 11/08/2007.  Findings: Aortic arch atherosclerosis.  Cardiopericardial silhouette within normal limits. Mediastinal contours  normal. Trachea midline.  No airspace disease or effusion. Monitoring leads are projected over the chest.  IMPRESSION: No active cardiopulmonary disease.   Original Report Authenticated By: Andreas Newport, M.D.    Ct Head Wo Contrast  07/14/2012  *RADIOLOGY REPORT*  Clinical Data: Increasing episodes of falling.  Loss of consciousness.  CT HEAD WITHOUT CONTRAST  Technique:  Contiguous axial images were obtained from the base of the skull through the vertex without contrast.  Comparison: 04/22/2008  Findings: The ventricles are normal in overall configuration. There is ventricular and sulcal enlargement reflecting moderate atrophy.  No convincing hydrocephalus.  There are no parenchymal masses or mass effect.  There is no evidence of a recent infarct.  There is no intracranial hemorrhage.  There are no extra-axial masses or abnormal fluid collections.  There are bilateral frontal and parietal burr holes which is stable presumably for creation of previous subdural collections.  Old nasal fractures.  Mild ethmoid sinus mucosal thickening.  The remaining visualized sinuses and the mastoid air cells are clear.  IMPRESSION: No acute intracranial abnormalities.  Moderate atrophy.  Changes from previous bilateral frontal and parietal burr holes.   Original Report Authenticated By: Amie Portland, M.D.    EKG-NSR, LAD, 1st degree AVB  IMPRESSION:  Principal Problem:  CHB (complete heart block), noticed on telemetry at discharge  Active Problems:  Frequent falls  *Episodic lightheadedness  Debility, unspecified  HTN (hypertension)  Hypokalemia  SDH (subdural hematoma) after a fall 2009   RECOMMENDATION: He will need a pacemaker, will discuss timing with Dr Allyson Sabal. Will move to stepdown. We can take on our service.  Time Spent Directly with Patient: 45 minutes  KILROY,LUKE K 07/15/2012, 2:40 PM   Agree with note written by Corine Shelter [facility]  Admitted with 'weak spells', found to have intermittent  high grade AVB with bradycardia. Exam benign. Not on any negative chronotropes. WIll need PTVPM. Will look into timing with Dr. Salena Saner. Move to step down.  Runell Gess 07/15/2012 3:30 PM

## 2012-07-15 NOTE — Progress Notes (Signed)
Pt noted to have HR dropping to low 30's nonsustained.  Pt asleep at time of event.  Easily aroused.  No complaints.  VSS.  Similar episode occurred approximately 15 minutes later.  Dr. Ardyth Harps notified.  Will continue to monitor. Jong Rickman, [facility]

## 2012-07-15 NOTE — Evaluation (Addendum)
Physical Therapy Evaluation Patient Details Name: Darren Rodriguez MRN: [id number] DOB: 08-May-1924 Today's Date: 07/15/2012 Time: 4098-1191 PT Time Calculation (min): 24 min  PT Assessment / Plan / Recommendation Clinical Impression  Pt is 78 y/o male admitted for LE edema and current episodes of lightheadness with no syncope episode.  Pt reports to one fall due to tripping over rug but no other falls.  Pt takes care of wife which is blind.  Pt will benefit from acute PT services to improve overall mobility and further assess balance for possible walking device.  Pt may benefit from RW.    PT Assessment  Patient needs continued PT services    Follow Up Recommendations  Home health PT;Supervision/Assistance - 24 hour    Does the patient have the potential to tolerate intense rehabilitation      Barriers to Discharge Decreased caregiver support      Equipment Recommendations  Rolling walker with 5" wheels    Recommendations for Other Services     Frequency Min 3X/week    Precautions / Restrictions Precautions Precautions: Fall   Pertinent Vitals/Pain No c/o pain;   BP sitting:  130/64 BP standing:  110/66 BP sitting:  117/66       Mobility  Bed Mobility Bed Mobility: Not assessed Transfers Transfers: Sit to Stand;Stand to Sit Sit to Stand: 4: Min guard;From bed Stand to Sit: 4: Min guard;To chair/3-in-1;To bed Details for Transfer Assistance: minguard for safety Ambulation/Gait Ambulation/Gait Assistance: 4: Min assist Ambulation Distance (Feet): 60 Feet Assistive device: 1 person hand held assist Ambulation/Gait Assistance Details: (A) to maintain balance due to occasional LOB.   Gait Pattern: Step-through pattern;Trunk flexed General Gait Details: decrease Stairs: No    Shoulder Instructions     Exercises     PT Diagnosis: Difficulty walking;Generalized weakness  PT Problem List: Decreased strength;Decreased activity tolerance;Decreased balance;Decreased  mobility;Decreased knowledge of use of DME PT Treatment Interventions: DME instruction;Gait training;Stair training;Functional mobility training;Therapeutic activities;Therapeutic exercise;Balance training;Neuromuscular re-education;Patient/family education   PT Goals Acute Rehab PT Goals PT Goal Formulation: With patient Time For Goal Achievement: 07/22/12 Potential to Achieve Goals: Good Pt will go Supine/Side to Sit: Independently PT Goal: Supine/Side to Sit - Progress: Goal set today Pt will go Sit to Supine/Side: Independently PT Goal: Sit to Supine/Side - Progress: Goal set today Pt will go Sit to Stand: Independently PT Goal: Sit to Stand - Progress: Goal set today Pt will go Stand to Sit: Independently PT Goal: Stand to Sit - Progress: Goal set today Pt will Ambulate: >150 feet;with modified independence;with least restrictive assistive device PT Goal: Ambulate - Progress: Goal set today  Visit Information  Last PT Received On: 07/15/12 Assistance Needed: +1    Subjective Data  Subjective: "I can't find my glasses." Patient Stated Goal: To return home to wife   Prior Functioning  Home Living Lives With: Spouse Available Help at Discharge:  (wife unable to assist pt at home ) Type of Home: Independent living facility Home Access: Level entry Home Layout: One level Bathroom Shower/Tub: Walk-in shower;Door [employer] Toilet: Standard Bathroom Accessibility: Yes How Accessible: Accessible via walker Home Adaptive Equipment: None Prior Function Level of Independence: Independent Able to Take Stairs?: No Driving: No Vocation: Retired Comments: Pt eats meals in dining hall and has Hydrologist Communication: No difficulties    Cognition  Overall Cognitive Status: Appears within functional limits for tasks assessed/performed Arousal/Alertness: Awake/alert Orientation Level: Appears intact for tasks assessed Behavior During Session: [facility] for tasks performed  Extremity/Trunk Assessment Right Lower Extremity Assessment RLE ROM/Strength/Tone: Within functional levels Left Lower Extremity Assessment LLE ROM/Strength/Tone: Within functional levels   Balance    End of Session PT - End of Session Equipment Utilized During Treatment: Gait belt Activity Tolerance: Patient tolerated treatment well Patient left: in chair;with call bell/phone within reach Nurse Communication: Mobility status  GP Functional Limitation: Mobility: Walking and moving around Mobility: Walking and Moving Around Current Status (H0865): At least 20 percent but less than 40 percent impaired, limited or restricted Mobility: Walking and Moving Around Goal Status [id number]): 0 percent impaired, limited or restricted   [name] 07/15/2012, 10:40 AM [name], PT DPT [phone]

## 2012-07-15 NOTE — Progress Notes (Signed)
Corine Shelter, [place] made aware pt's HR frequently in the 30's, pt symptomatic --dozing off mid sentence.  Pt started on Dopamine gtt per Kilroy's order.  RN continuing to monitor.

## 2012-07-15 NOTE — Progress Notes (Signed)
Patient was to be discharged today. However, it was noted on telemetry that he has second degree heart block. DC has been held. I have consulted [facility], Dr. Allyson Sabal. ??need for a pacemaker. Further decisions to be made pending cardiology recommendations.  Peggye Pitt, MD [facility] Pager: [phone]

## 2012-07-15 NOTE — Progress Notes (Signed)
Pt BP increased to 180/55; Dopamine rate decreased from 8 mcg/hr to68mcg; pt shortly c/o nausea and "not feeling quiet right"; vomited mostly phlegm; HR had dropped from 70s to 49 at the time he stated he didn't feel "right"; will continue to monitor pt's HR and BP and titrate Dopamine as needed per order parameters

## 2012-07-15 NOTE — Discharge Summary (Signed)
Physician Discharge Summary  Patient ID: Darren Rodriguez MRN: [id number] DOB/AGE: 08-20-1923 78 y.o.  Admit date: 07/14/2012 Discharge date: 07/15/2012  Primary Care Physician:  No primary provider on file.   Discharge Diagnoses:    Principal Problem:  *Episodic lightheadedness Active Problems:  HTN (hypertension)  Hypokalemia  Frequent falls  Debility, unspecified      Medication List     As of 07/15/2012 11:27 AM    STOP taking these medications         metolazone 2.5 MG tablet   Commonly known as: ZAROXOLYN      TAKE these medications         aspirin EC 81 MG tablet   Take 162 mg by mouth daily.      colchicine 0.6 MG tablet   Take 0.6 mg by mouth daily.      cyanocobalamin 1000 MCG/ML injection   Commonly known as: (VITAMIN B-12)   Inject 1,000 mcg into the muscle every 30 (thirty) days. Takes on last week of month      furosemide 40 MG tablet   Commonly known as: LASIX   Take 60 mg by mouth daily.      lisinopril 20 MG tablet   Commonly known as: PRINIVIL,ZESTRIL   Take 20 mg by mouth 2 (two) times daily.      lovastatin 40 MG tablet   Commonly known as: MEVACOR   Take 40 mg by mouth daily.      omeprazole 20 MG capsule   Commonly known as: PRILOSEC   Take 20 mg by mouth daily.      potassium chloride SA 20 MEQ tablet   Commonly known as: K-DUR,KLOR-CON   Take 20 mEq by mouth daily.      terazosin 2 MG capsule   Commonly known as: HYTRIN   Take 2 mg by mouth 2 (two) times daily.      Vitamin D (Ergocalciferol) 50000 UNITS Caps   Commonly known as: DRISDOL   Take 50,000 Units by mouth every 7 (seven) days. Takes on saturdays         Disposition and Follow-up:  Will be discharged home today in stable and improved condition. Has been advised to follow up with his PCP in 2 weeks.  Consults:  None   Significant Diagnostic Studies:  Dg Chest 2 View  07/14/2012  *RADIOLOGY REPORT*  Clinical Data: Syncope.  CHEST - 2 VIEW  Comparison:  11/08/2007.  Findings: Aortic arch atherosclerosis.  Cardiopericardial silhouette within normal limits. Mediastinal contours normal. Trachea midline.  No airspace disease or effusion. Monitoring leads are projected over the chest.  IMPRESSION: No active cardiopulmonary disease.   Original Report Authenticated By: Andreas Newport, M.D.    Ct Head Wo Contrast  07/14/2012  *RADIOLOGY REPORT*  Clinical Data: Increasing episodes of falling.  Loss of consciousness.  CT HEAD WITHOUT CONTRAST  Technique:  Contiguous axial images were obtained from the base of the skull through the vertex without contrast.  Comparison: 04/22/2008  Findings: The ventricles are normal in overall configuration. There is ventricular and sulcal enlargement reflecting moderate atrophy.  No convincing hydrocephalus.  There are no parenchymal masses or mass effect.  There is no evidence of a recent infarct.  There is no intracranial hemorrhage.  There are no extra-axial masses or abnormal fluid collections.  There are bilateral frontal and parietal burr holes which is stable presumably for creation of previous subdural collections.  Old nasal fractures.  Mild ethmoid sinus mucosal thickening.  The remaining visualized sinuses and the mastoid air cells are clear.  IMPRESSION: No acute intracranial abnormalities.  Moderate atrophy.  Changes from previous bilateral frontal and parietal burr holes.   Original Report Authenticated By: Amie Portland, M.D.     Brief H and P: For complete details please refer to admission H and P, but in brief patient is an 78 y/o man with HTN and CAD who presented with hx of ocasional lightheadedness for years. It occurs randomly sometimes when he stands up or sometimes when he is sitting. He had an other episode few days ago that was worse than usual. He was laying down on the sofa and felt weak all over this seemed to last a bit longer than usual so his aids have reported this to the nursing staff.  He also noted  some swelling in his lower legs for the past 6 months that was new His cardiologist have increased his lasix.  10 days ago he had a mechanical fall denies any head injury. He denies any hx of ever synopsizing. We were asked to admit him for further evaluation and management.     Hospital Course:  Principal Problem:  *Episodic lightheadedness Active Problems:  HTN (hypertension)  Hypokalemia  Frequent falls  Debility, unspecified   Episodic Dizziness -Cause unclear. -He is not orthostatic. -CT Head neg. -No focal neurologic findings to suspect CVA, plus timing would not validate this diagnosis. -This has been present for years, so further workup can occur in the outpatient setting. -Patient is anxious to go home today. -PT is recommending HHPT which will be arranged. -Have discontinued his once weekly metaxalone; otherwise his medications remain the same.   Time spent on Discharge: Greater than 30 minutes.  SignedChaya Jan [facility] Pager: [phone] 07/15/2012, 11:27 AM

## 2012-07-15 NOTE — ED Notes (Signed)
Updated on plan of care: aware that he will be admitted to the hospital and be here over night. We are currently waiting for bed placement. No further needs at this time. A.O. X 4. Skin warm and dry. NAD. Vitals stable. Respirations even and regular. NAD.

## 2012-07-15 NOTE — Progress Notes (Signed)
CARE MANAGEMENT NOTE 07/15/2012  Patient:  Darren Rodriguez, Darren Rodriguez   Account Number:  [id number]  Date Initiated:  07/15/2012  Documentation initiated by:  Vance Peper  Subjective/Objective Assessment:   78 yr old male adm for LE edema, light headedness     Action/Plan:   CM spoke with patient. Choice offered for [facility] PT/OT. Patient states he has questions that he wil ask therapist. States he has a walker that he doesnt use.   Anticipated DC Date:  07/15/2012   Anticipated DC Plan:  HOME W HOME HEALTH SERVICES      DC Planning Services  CM consult      [facility] Choice  HOME HEALTH   Choice offered to / List presented to:  C-1 Patient        HH arranged  HH-2 PT  HH-3 OT      [facility] agency  [facility]   Status of service:  Completed, signed off Medicare Important Message given?   (If response is "NO", the following Medicare IM given date fields will be blank) Date Medicare IM given:   Date Additional Medicare IM given:    Discharge Disposition:  HOME W HOME HEALTH SERVICES  Per UR Regulation:    If discussed at Long Length of Stay Meetings, dates discussed:    Comments:

## 2012-07-16 DIAGNOSIS — I442 Atrioventricular block, complete: Principal | ICD-10-CM

## 2012-07-16 DIAGNOSIS — R55 Syncope and collapse: Secondary | ICD-10-CM

## 2012-07-16 LAB — BASIC METABOLIC PANEL
BUN: 18 mg/dL (ref 6–23)
CO2: 29 mEq/L (ref 19–32)
Calcium: 9.6 mg/dL (ref 8.4–10.5)
Chloride: 101 mEq/L (ref 96–112)
Creatinine, Ser: 0.82 mg/dL (ref 0.50–1.35)
GFR calc Af Amer: >90 mL/min (ref >90)
GFR calc non Af Amer: 78 mL/min — ABNORMAL LOW (ref >90)
Glucose, Bld: 123 mg/dL — ABNORMAL HIGH (ref 70–99)
Potassium: 3.5 mEq/L (ref 3.5–5.1)
Sodium: 140 mEq/L (ref 135–145)

## 2012-07-16 LAB — TSH: TSH: 0.6 u[IU]/mL (ref 0.350–4.500)

## 2012-07-16 MED ORDER — SODIUM CHLORIDE 0.45 % IV SOLN
INTRAVENOUS | Status: DC
  Administered 2012-07-17: 06:00:00 via INTRAVENOUS

## 2012-07-16 MED ORDER — CHLORHEXIDINE GLUCONATE 4 % EX LIQD
60.0000 mL | Freq: Once | CUTANEOUS | Status: AC
  Administered 2012-07-17: 4 via TOPICAL

## 2012-07-16 MED ORDER — CHLORHEXIDINE GLUCONATE 4 % EX LIQD
60.0000 mL | Freq: Once | CUTANEOUS | Status: AC
  Administered 2012-07-16: 4 via TOPICAL
  Filled 2012-07-16: qty 60

## 2012-07-16 MED ORDER — SODIUM CHLORIDE 0.9 % IR SOLN
80.0000 mg | Status: DC
  Filled 2012-07-16: qty 2

## 2012-07-16 MED ORDER — CEFAZOLIN SODIUM-DEXTROSE 2-3 GM-% IV SOLR
2.0000 g | INTRAVENOUS | Status: DC
  Filled 2012-07-16: qty 50

## 2012-07-16 NOTE — Progress Notes (Signed)
Subjective:  No CP/SOB or significant dizziness  Objective:  Temp:  [97.9 F (36.6 C)-98.7 F (37.1 C)] 97.9 F (36.6 C) (12/22 0347) Pulse Rate:  [42-76] 66  (12/21 2000) BP: (114-180)/(37-80) 149/51 mmHg (12/22 0500) SpO2:  [89 %-100 %] 99 % (12/22 0500) Weight change:   Intake/Output from previous day: 12/21 0701 - 12/22 0700 In: 890.2 [P.O.:360; I.V.:530.2] Out: 2625 [Urine:2625]  Intake/Output from this shift:    Physical Exam: General appearance: alert, cooperative and no distress Neck: no adenopathy, no carotid bruit, no JVD, supple, symmetrical, trachea midline and thyroid not enlarged, symmetric, no tenderness/mass/nodules Lungs: clear to auscultation bilaterally Heart: regular rate and rhythm, S1, S2 normal, no murmur, click, rub or gallop Extremities: extremities normal, atraumatic, no cyanosis or edema  Lab Results: Results for orders placed during the hospital encounter of 07/14/12 (from the past 48 hour(s))  CBC     Status: Abnormal   Collection Time   07/14/12  7:45 PM      Component Value Range Comment   WBC 4.7  4.0 - 10.5 K/uL    RBC 4.00 (*) 4.22 - 5.81 MIL/uL    Hemoglobin 11.7 (*) 13.0 - 17.0 g/dL    HCT 16.1 (*) 09.6 - 52.0 %    MCV 89.8  78.0 - 100.0 fL    MCH 29.3  26.0 - 34.0 pg    MCHC 32.6  30.0 - 36.0 g/dL    RDW 04.5  40.9 - 81.1 %    Platelets 151  150 - 400 K/uL   BASIC METABOLIC PANEL     Status: Abnormal   Collection Time   07/14/12  7:45 PM      Component Value Range Comment   Sodium 140  135 - 145 mEq/L    Potassium 3.4 (*) 3.5 - 5.1 mEq/L    Chloride 101  96 - 112 mEq/L    CO2 30  19 - 32 mEq/L    Glucose, Bld 102 (*) 70 - 99 mg/dL    BUN 28 (*) 6 - 23 mg/dL    Creatinine, Ser 9.14  0.50 - 1.35 mg/dL    Calcium 9.9  8.4 - 78.2 mg/dL    GFR calc non Af Amer 62 (*) >90 mL/min    GFR calc Af Amer 72 (*) >90 mL/min   POCT I-STAT TROPONIN I     Status: Normal   Collection Time   07/14/12  8:19 PM      Component Value Range  Comment   Troponin i, poc 0.00  0.00 - 0.08 ng/mL    Comment 3            URINALYSIS, ROUTINE W REFLEX MICROSCOPIC     Status: Normal   Collection Time   07/14/12 11:12 PM      Component Value Range Comment   Color, Urine YELLOW  YELLOW    APPearance CLEAR  CLEAR    Specific Gravity, Urine 1.019  1.005 - 1.030    pH 5.0  5.0 - 8.0    Glucose, UA NEGATIVE  NEGATIVE mg/dL    Hgb urine dipstick NEGATIVE  NEGATIVE    Bilirubin Urine NEGATIVE  NEGATIVE    Ketones, ur NEGATIVE  NEGATIVE mg/dL    Protein, ur NEGATIVE  NEGATIVE mg/dL    Urobilinogen, UA 0.2  0.0 - 1.0 mg/dL    Nitrite NEGATIVE  NEGATIVE    Leukocytes, UA NEGATIVE  NEGATIVE MICROSCOPIC NOT DONE ON URINES WITH NEGATIVE PROTEIN, BLOOD,  LEUKOCYTES, NITRITE, OR GLUCOSE <1000 mg/dL.  PROTIME-INR     Status: Normal   Collection Time   07/14/12 11:20 PM      Component Value Range Comment   Prothrombin Time 12.8  11.6 - 15.2 seconds    INR 0.97  0.00 - 1.49   TROPONIN I     Status: Normal   Collection Time   07/15/12  1:35 AM      Component Value Range Comment   Troponin I <0.30  <0.30 ng/mL   MAGNESIUM     Status: Normal   Collection Time   07/15/12  5:34 AM      Component Value Range Comment   Magnesium 2.0  1.5 - 2.5 mg/dL   PHOSPHORUS     Status: Normal   Collection Time   07/15/12  5:34 AM      Component Value Range Comment   Phosphorus 3.4  2.3 - 4.6 mg/dL   TSH     Status: Normal   Collection Time   07/15/12  5:34 AM      Component Value Range Comment   TSH 1.279  0.350 - 4.500 uIU/mL   COMPREHENSIVE METABOLIC PANEL     Status: Abnormal   Collection Time   07/15/12  5:34 AM      Component Value Range Comment   Sodium 141  135 - 145 mEq/L    Potassium 3.6  3.5 - 5.1 mEq/L    Chloride 104  96 - 112 mEq/L    CO2 26  19 - 32 mEq/L    Glucose, Bld 120 (*) 70 - 99 mg/dL    BUN 23  6 - 23 mg/dL    Creatinine, Ser 1.61  0.50 - 1.35 mg/dL    Calcium 9.1  8.4 - 09.6 mg/dL    Total Protein 5.8 (*) 6.0 - 8.3 g/dL     Albumin 3.0 (*) 3.5 - 5.2 g/dL    AST 16  0 - 37 U/L    ALT 10  0 - 53 U/L    Alkaline Phosphatase 72  39 - 117 U/L    Total Bilirubin 0.2 (*) 0.3 - 1.2 mg/dL    GFR calc non Af Amer 76 (*) >90 mL/min    GFR calc Af Amer 88 (*) >90 mL/min   CBC     Status: Abnormal   Collection Time   07/15/12  5:34 AM      Component Value Range Comment   WBC 4.8  4.0 - 10.5 K/uL    RBC 3.41 (*) 4.22 - 5.81 MIL/uL    Hemoglobin 10.0 (*) 13.0 - 17.0 g/dL    HCT 04.5 (*) 40.9 - 52.0 %    MCV 89.1  78.0 - 100.0 fL    MCH 29.3  26.0 - 34.0 pg    MCHC 32.9  30.0 - 36.0 g/dL    RDW 81.1  91.4 - 78.2 %    Platelets 142 (*) 150 - 400 K/uL   TROPONIN I     Status: Normal   Collection Time   07/15/12  9:20 AM      Component Value Range Comment   Troponin I <0.30  <0.30 ng/mL   MRSA PCR SCREENING     Status: Normal   Collection Time   07/15/12  5:38 PM      Component Value Range Comment   MRSA by PCR NEGATIVE  NEGATIVE   BASIC METABOLIC PANEL     Status: Abnormal  Collection Time   07/16/12  5:08 AM      Component Value Range Comment   Sodium 140  135 - 145 mEq/L    Potassium 3.5  3.5 - 5.1 mEq/L    Chloride 101  96 - 112 mEq/L    CO2 29  19 - 32 mEq/L    Glucose, Bld 123 (*) 70 - 99 mg/dL    BUN 18  6 - 23 mg/dL    Creatinine, Ser 1.61  0.50 - 1.35 mg/dL    Calcium 9.6  8.4 - 09.6 mg/dL    GFR calc non Af Amer 78 (*) >90 mL/min    GFR calc Af Amer >90  >90 mL/min     Imaging: Imaging results have been reviewed  Assessment/Plan:   1. Principal Problem: 2.  *CHB (complete heart block), noticed on telemetry at discharge 3. Active Problems: 4.  Episodic lightheadedness 5.  HTN (hypertension) 6.  Hypokalemia 7.  Frequent falls 8.  Debility, unspecified 9.  SDH (subdural hematoma) after a fall 2009 10.   Time Spent Directly with Patient:  20 minutes  Length of Stay:  LOS: 2 days   Pt was put on low dose dopamine yesterday for symptomatic bradycardia which was effective. No  major episodes last PM. Labs ok. Will put on add on board for PTVPM first case tomorrow with Dr. Salena Saner. Pt agreeable.  Runell Gess 07/16/2012, 7:43 AM

## 2012-07-16 NOTE — Progress Notes (Signed)
Hold Lasix and Lovenox am of Pacemaker.  Corine Shelter PA-C 07/16/2012 8:56 AM

## 2012-07-16 NOTE — Progress Notes (Signed)
Per Dr. Hazle Coca note 12/21, ok to transfer patient to their service. Will sign off, but please call back with questions.  Peggye Pitt, MD [facility] Pager: [phone]

## 2012-07-17 ENCOUNTER — Encounter (HOSPITAL_COMMUNITY)
Admission: EM | Disposition: A | Discharge status: Home Health | Payer: Self-pay | Source: Home / Self Care | Attending: Cardiovascular Disease

## 2012-07-17 ENCOUNTER — Inpatient Hospital Stay (HOSPITAL_COMMUNITY): Payer: Medicare Other

## 2012-07-17 HISTORY — PX: PERMANENT PACEMAKER INSERTION: SHX5480

## 2012-07-17 SURGERY — PERMANENT PACEMAKER INSERTION
Anesthesia: LOCAL

## 2012-07-17 MED ORDER — LIDOCAINE HCL (PF) 1 % IJ SOLN
INTRAMUSCULAR | Status: AC
  Filled 2012-07-17: qty 60

## 2012-07-17 MED ORDER — SODIUM CHLORIDE 0.9 % IV SOLN: 250.0000 mL | INTRAVENOUS | Status: DC | PRN

## 2012-07-17 MED ORDER — SODIUM CHLORIDE 0.9 % IJ SOLN
3.0000 mL | Freq: Two times a day (BID) | INTRAMUSCULAR | Status: DC
  Administered 2012-07-17: 17:00:00 via INTRAVENOUS
  Administered 2012-07-18: 3 mL via INTRAVENOUS

## 2012-07-17 MED ORDER — SODIUM CHLORIDE 0.9 % IV SOLN
INTRAVENOUS | Status: AC
  Administered 2012-07-17: 11:00:00 via INTRAVENOUS

## 2012-07-17 MED ORDER — HEPARIN (PORCINE) IN NACL 2-0.9 UNIT/ML-% IJ SOLN
INTRAMUSCULAR | Status: AC
  Filled 2012-07-17: qty 1000

## 2012-07-17 MED ORDER — ACETAMINOPHEN 325 MG PO TABS: 325.0000 mg | ORAL_TABLET | ORAL | Status: DC | PRN

## 2012-07-17 MED ORDER — ONDANSETRON HCL 4 MG/2ML IJ SOLN: 4.0000 mg | Freq: Four times a day (QID) | INTRAMUSCULAR | Status: DC | PRN

## 2012-07-17 MED ORDER — SODIUM CHLORIDE 0.9 % IJ SOLN: 3.0000 mL | INTRAMUSCULAR | Status: DC | PRN

## 2012-07-17 MED ORDER — MIDAZOLAM HCL 2 MG/2ML IJ SOLN
INTRAMUSCULAR | Status: AC
  Filled 2012-07-17: qty 2

## 2012-07-17 MED ORDER — FENTANYL CITRATE 0.05 MG/ML IJ SOLN
INTRAMUSCULAR | Status: AC
  Filled 2012-07-17: qty 2

## 2012-07-17 MED ORDER — CEFAZOLIN SODIUM 1-5 GM-% IV SOLN
1.0000 g | Freq: Four times a day (QID) | INTRAVENOUS | Status: AC
  Administered 2012-07-17 (×2): 1 g via INTRAVENOUS
  Filled 2012-07-17 (×4): qty 50

## 2012-07-17 NOTE — Discharge Summary (Signed)
Patient ID: Darren Rodriguez,  MRN: [id number], DOB/AGE: [birth year] 78 y.o.  Admit date: 07/14/2012 Discharge date: 07/17/2012  Primary Care Provider: Dr Sheryn Bison Primary Cardiologist: Dr Alanda Amass  Discharge Diagnoses  Principal Problem:  *CHB, St Jude pacemaker 07/17/12  Active Problems:  Episodic lightheadedness  Frequent falls  Debility, unspecified  HTN (hypertension)  Hypokalemia  SDH (subdural hematoma) after a fall 2009    Procedures: St Jude pacemaker implant 07/17/12   Hospital Course: This is an 78 y.o.married male, father of two, grandfather of 4 followed by [place] long term for hypertension and significant venous insufficiency with edema of his legs. Venous studies in June of 2013 showed venous insufficiency in the deep and superficial veins and it was recommended that he not have endovenous ablation. He has not had any chest pain and is not suspected of having coronary disease. His activity is limited. He lives with his wife at [employer]. He has never required catheterization, and he has had a negative Myoview for ischemia 4/11. LV function was normal on 2D echo of 5/13 with no significant valve disease. He has had 1st degree AVB on prior EKGs but no history of PAF or arrythmia. He was admitted 07/14/12 by his family because of "weak spells". He denies any true syncope though he seems slightly confused and is somewhat of a rambling historian. He was scheduled for discharge 12/21 when he was noted to have AVB on telemetry with VR in the high 30s. We were asked to see him in consult. Review of his telemetry shows intermittent CHB. The pt was transferred to our service and moved to stepdown. Lat on the 21st he began having symptomatic bradycardia and we started him on Dopamine with good results. He underwent pacemaker implant 07/17/12 by Dr Royann Shivers with a St Jude device. He tolerated this well. CXR at discharge shows no pneumothorax. Wound site is table.      Discharge  Vitals:  Blood pressure 133/70, pulse 64, temperature 97.8 F (36.6 C), temperature source Oral, resp. rate 18, height 6\' 2"  (1.88 m), weight 78.4 kg (172 lb 13.5 oz), SpO2 98.00%.    Labs: Results for orders placed during the hospital encounter of 07/14/12 (from the past 48 hour(s))  MRSA PCR SCREENING     Status: Normal   Collection Time   07/15/12  5:38 PM      Component Value Range Comment   MRSA by PCR NEGATIVE  NEGATIVE   BASIC METABOLIC PANEL     Status: Abnormal   Collection Time   07/16/12  5:08 AM      Component Value Range Comment   Sodium 140  135 - 145 mEq/L    Potassium 3.5  3.5 - 5.1 mEq/L    Chloride 101  96 - 112 mEq/L    CO2 29  19 - 32 mEq/L    Glucose, Bld 123 (*) 70 - 99 mg/dL    BUN 18  6 - 23 mg/dL    Creatinine, Ser 4.09  0.50 - 1.35 mg/dL    Calcium 9.6  8.4 - 81.1 mg/dL    GFR calc non Af Amer 78 (*) >90 mL/min    GFR calc Af Amer >90  >90 mL/min   TSH     Status: Normal   Collection Time   07/16/12  5:08 AM      Component Value Range Comment   TSH 0.600  0.350 - 4.500 uIU/mL     Disposition:  Follow-up Information    Follow up with HAGER, BRYAN, PA. Schedule an appointment as soon as possible for a visit in 2 weeks. (3pm)    Contact information:   [address] [place] [address] [phone]          Discharge Medications:    Medication List     As of 07/17/2012  2:21 PM    STOP taking these medications         metolazone 2.5 MG tablet   Commonly known as: ZAROXOLYN      TAKE these medications         aspirin EC 81 MG tablet   Take 162 mg by mouth daily.      colchicine 0.6 MG tablet   Take 0.6 mg by mouth daily.      cyanocobalamin 1000 MCG/ML injection   Commonly known as: (VITAMIN B-12)   Inject 1,000 mcg into the muscle every 30 (thirty) days. Takes on last week of month      furosemide 40 MG tablet   Commonly known as: LASIX   Take 60 mg by mouth daily.      lisinopril 20 MG  tablet   Commonly known as: PRINIVIL,ZESTRIL   Take 20 mg by mouth 2 (two) times daily.      lovastatin 40 MG tablet   Commonly known as: MEVACOR   Take 40 mg by mouth daily.      omeprazole 20 MG capsule   Commonly known as: PRILOSEC   Take 20 mg by mouth daily.      potassium chloride SA 20 MEQ tablet   Commonly known as: K-DUR,KLOR-CON   Take 20 mEq by mouth daily.      terazosin 2 MG capsule   Commonly known as: HYTRIN   Take 2 mg by mouth 2 (two) times daily.      Vitamin D (Ergocalciferol) 50000 UNITS Caps   Commonly known as: DRISDOL   Take 50,000 Units by mouth every 7 (seven) days. Takes on saturdays           Duration of Discharge Encounter: Greater than 30 minutes including physician time.  Jolene Provost PA-C 07/17/2012 2:21 PM

## 2012-07-17 NOTE — Progress Notes (Signed)
PT/OT Cancellation Note  Patient Details Name: Darren Rodriguez MRN: [id number] DOB: 1923-10-13   Cancelled Treatment:     Pt receiving a pacemaker today, therapy cancelled.  Will continue to follow.  Evern Bio 07/17/2012, 9:14 AM [phone]

## 2012-07-17 NOTE — CV Procedure (Signed)
Darren Rodriguez, [name], 78 y.o., 02/03/1924  Location: [facility]  Bed: 2922-01  MRN: [id number]  CSN: [id number]  Admit Dt: 07/14/12   Procedure report  Procedure performed: 1. Implantation of new dual chamber permanent pacemaker 2. Fluoroscopy 3. Light sedation  Reason for procedure: Symptomatic bradycardia due to: Sinus node dysfunction Second degree atrioventricular block Mobitz type II  Procedure performed by: Thurmon Fair, MD  Complications: None  Estimated blood loss: <10 mL  Medications administered during procedure: Ancef 2 g intravenously Lidocaine 1% 30 mL locally,  Fentanyl 50 mcg intravenously Versed 2 mg intravenously  Device detailsDesigner, television/film set DR RF model [id number] serial number [id number] Right atrial lead [employer] Jude Tendrils STS model 2088TC-52 serial number [id number] Right ventricular lead St[employer] Jude tendril STS model 2088TC-58 serial number [id number]  Procedure details:  After the risks and benefits of the procedure were discussed the patient provided informed consent and was brought to the cardiac cath lab in the fasting state. The patient was prepped and draped in usual sterile fashion. Local anesthesia with 1% lidocaine was administered to to the left infraclavicular area. A 5-6 cm horizontal incision was made parallel with and 2-3 cm caudal to the left clavicle. Using electrocautery and blunt dissection a prepectoral pocket was created down to the level of the pectoralis major muscle fascia. The pocket was carefully inspected for hemostasis. An antibiotic-soaked sponge was placed in the pocket.  Under fluoroscopic guidance and using the modified Seldinger technique 2 separate venipunctures were performed to access the left subclavian vein. Moderate difficulty was encountered accessing the vein due to a large proximal valve and a Wholey wire was used.  Two J-tip guidewires were subsequently exchanged for two 7 French safe  sheaths.  Under fluoroscopic guidance the ventricular lead was advanced to level of the mid to apical right ventricular septum and thet active-fixation helix was deployed. Prominent current of injury was seen. Satisfactory pacing parameters were recorded, but sensing was mediocre in three different locations. There was no evidence of diaphragmatic stimulation at maximum device output. The safe sheath was peeled away and the lead was secured in place with 2-0 silk.  In similar fashion the right atrial lead was advanced to the level of the atrial appendage. The active-fixation helix was deployed. There was prominent current of injury. Satisfactory  pacing and sensing parameters were recorded. There was no evidence of diaphragmatic stimulation with pacing at maximum device output. The safe sheath was peeled away and the lead was secured in place with 2-0 silk.  The antibiotic-soaked sponge was removed from the pocket. The pocket was flushed with copious amounts of antibiotic solution. Reinspection showed excellent hemostasis.  The ventricular lead was connected to the generator and appropriate ventricular pacing was seen. Subsequently the atrial lead was also connected. Repeat testing of the lead parameters later showed excellent values.  The entire system was then carefully inserted in the pocket with care been taking that the leads and device assumed a comfortable position without pressure on the incision. Great care was taken that the leads be located deep to the generator. The pocket was then closed in layers using 2 layers of 2-0 Vicryl and cutaneous staples, after which a sterile dressing was applied.  At the end of the procedure the following lead parameters were encountered:  Right atrial lead  sensed P waves 2.1 mV, impedance 620 ohms, threshold 0.5 V at 0.5 ms pulse width.  Right ventricular lead sensed R waves 7.8 mV,  impedance 665 ohms, threshold 7.8 V at 0.5 ms pulse width.  Thurmon Fair, MD, [facility] [phone] office [phone] pager 07/17/2012  Cc: Pearletha Furl. Alanda Amass, MD

## 2012-07-17 NOTE — Progress Notes (Signed)
See OT note.    Alaila Pillard, PT [phone]  

## 2012-07-17 NOTE — Clinical Social Work Psychosocial (Signed)
     Clinical Social Work Department BRIEF PSYCHOSOCIAL ASSESSMENT 07/17/2012  Patient:  Darren Rodriguez, Darren Rodriguez     Account Number:  [id number]     Admit date:  07/14/2012  Clinical Social Worker:  Hulan Fray  Date/Time:  07/17/2012 03:36 PM  Referred by:  Care Management  Date Referred:  07/17/2012 Referred for  Other - See comment   Other Referral:   admit from facility   Interview type:  Patient Other interview type:    PSYCHOSOCIAL DATA Living Status:  FACILITY Admitted from facility:  [facility] Level of care:  Independent Living Primary support name:  Elya Diloreto Primary support relationship to patient:  CHILD, ADULT Degree of support available:   supportive    CURRENT CONCERNS Current Concerns  None Noted   Other Concerns:    SOCIAL WORK ASSESSMENT / PLAN Clinical Social Worker received referral for patient being admitted from facility. CSW introduced self and explained reason for visit. Patient reported that he is living with his wife, who is blind, in the independent living section of [facility]. Patient reported the plan is to return to the independent side at discharge with home health services. Patient reported that he has additional services coming in to assist because of his wife's needs. CSW will sign   Assessment/plan status:  No Further Intervention Required Other assessment/ plan:   Information/referral to community resources:   Patient is from facility. PT recommending HH services    PATIENTS/FAMILYS RESPONSE TO PLAN OF CARE: Patient reported that the plan is to return back to the independent section of [facility].

## 2012-07-18 ENCOUNTER — Inpatient Hospital Stay (HOSPITAL_COMMUNITY): Payer: Medicare Other

## 2012-07-18 MED ORDER — TERAZOSIN HCL 2 MG PO CAPS: 2.0000 mg | ORAL_CAPSULE | Freq: Every day | ORAL | Status: AC

## 2012-07-18 MED ORDER — ACETAMINOPHEN 325 MG PO TABS: 325.0000 mg | ORAL_TABLET | ORAL | Status: AC | PRN

## 2012-07-18 NOTE — Care Management Note (Signed)
    Page 1 of 2   07/18/2012     11:52:12 AM   CARE MANAGEMENT NOTE 07/18/2012  Patient:  Darren Rodriguez, Darren Rodriguez   Account Number:  [id number]  Date Initiated:  07/15/2012  Documentation initiated by:  Vance Peper  Subjective/Objective Assessment:   78 yr old male adm for LE edema, light headedness     Action/Plan:   CM spoke with patient. Choice offered for [facility] PT/OT. Patient states he has questions that he wil ask therapist. States he has a walker that he doesnt use.   Anticipated DC Date:  07/18/2012   Anticipated DC Plan:  HOME W HOME HEALTH SERVICES  In-house referral  Clinical Social Worker      DC Planning Services  CM consult      [facility] Choice  HOME HEALTH   Choice offered to / List presented to:  C-1 Patient        HH arranged  HH-2 PT  HH-3 OT      HH agency  OTHER - SEE NOTE   Status of service:  Completed, signed off Medicare Important Message given?   (If response is "NO", the following Medicare IM given date fields will be blank) Date Medicare IM given:   Date Additional Medicare IM given:    Discharge Disposition:  HOME W HOME HEALTH SERVICES  Per UR Regulation:  Reviewed for med. necessity/level of care/duration of stay  If discussed at Long Length of Stay Meetings, dates discussed:    Comments:  12/24 1150a debbie Denise Washburn rn,bsn spoke w [facility] and they state [facility] has certain agencies they like to use. spoke w [facility] and they use [facility]. spoke w pt and he and wife use [facility]. ref w [facility] cancelled and faxed inform for ref to [facility] as pt req and per [facility] rec also.

## 2012-07-18 NOTE — Progress Notes (Signed)
Pt discharged to home, instructions given to pt and he verbalized understanding. PIV pulled cath intact, site unremakable. Pt left floor via wheelchair with family and all belongings

## 2012-07-18 NOTE — Progress Notes (Signed)
S/P Implantation of new dual chamber permanent pacemaker. POD#1  Subjective:  Doing well. No complaints.  Objective:  Vital Signs in the last 24 hours: Temp:  [97.4 F (36.3 C)-98 F (36.7 C)] 97.4 F (36.3 C) (12/24 0754) Pulse Rate:  [61-68] 68  (12/24 0400) Resp:  [12-18] 16  (12/24 0754) BP: (100-133)/(56-82) 133/70 mmHg (12/24 0400) SpO2:  [88 %-98 %] 88 % (12/24 0530) FiO2 (%):  [2 %] 2 % (12/24 0559) Weight:  [75.7 kg (166 lb 14.2 oz)] 75.7 kg (166 lb 14.2 oz) (12/24 0400)  Intake/Output from previous day:  Intake/Output Summary (Last 24 hours) at 07/18/12 0802 Last data filed at 07/18/12 0500  Gross per 24 hour  Intake    100 ml  Output    900 ml  Net   -800 ml    Physical Exam: General appearance: alert, cooperative and no distress Lungs: clear to auscultation bilaterally Heart: regular rate and rhythm Chest Wall: surgical incision intact with staples, surrounding skin mildly erythematous, no ecchymosis, no swelling, drainage or discharge Extremities: No LEE Pulses: 2+ and symmetric Skin: warm and dry Neurologic: Grossly normal   Rate:68  Rhythm: normal sinus rhythm  Lab Results: No results found for this basename: WBC:2,HGB:2,PLT:2 in the last 72 hours  Basename 07/16/12 0508  NA 140  K 3.5  CL 101  CO2 29  GLUCOSE 123*  BUN 18  CREATININE 0.82    Basename 07/15/12 0920  TROPONINI <0.30   Hepatic Function Panel No results found for this basename: PROT,ALBUMIN,AST,ALT,ALKPHOS,BILITOT,BILIDIR,IBILI in the last 72 hours No results found for this basename: CHOL in the last 72 hours No results found for this basename: INR in the last 72 hours  Imaging: Imaging results have been reviewed  Cardiac Studies:  Assessment/Plan:   Principal Problem:  *CHB, [employer] pacemaker 07/17/12 Active Problems:  Episodic lightheadedness  Frequent falls  Debility, unspecified  HTN (hypertension)  Hypokalemia  SDH (subdural hematoma) after a fall  2009   Plan: S/P Implantation of new dual chamber permanent pacemaker 12/23. Pacemaker was interrogated this morning by [employer] rep. Findings : AP 60%, VP 68%. Postoperative CXR yesterday showed proper lead placement and no pneumothorax. Repeat CXR today. If ok can, can D/C home today. Follow up with Dr. Royann Shivers.    [employer] PA-C 07/18/2012, 8:02 AM

## 2012-07-18 NOTE — Progress Notes (Signed)
Physical Therapy Treatment Patient Details Name: Darren Rodriguez MRN: [id number] DOB: Oct 12, 1923 Today's Date: 07/18/2012 Time: 4098-1191 PT Time Calculation (min): 23 min  PT Assessment / Plan / Recommendation Comments on Treatment Session  Mr. Bona is moving well but still with some generalized weakness for which he is agreeable to HHPT. Educated pt on ICD/pacemaker precautions for LUE and the use of RW for stability when going home with good verbal understanding.  Likely to d/c home today. Per notes pt will bring in extra help for his wife for a little bit.     Follow Up Recommendations  Home health PT;Supervision/Assistance - 24 hour     Does the patient have the potential to tolerate intense rehabilitation     Barriers to Discharge        Equipment Recommendations  Rolling walker with 5" wheels    Recommendations for Other Services    Frequency Min 3X/week   Plan Discharge plan remains appropriate;Frequency remains appropriate    Precautions / Restrictions Precautions Precautions: Fall;ICD/Pacemaker Restrictions Weight Bearing Restrictions: No       Mobility  Bed Mobility Bed Mobility: Supine to Sit;Sitting - Scoot to Edge of Bed Supine to Sit: HOB flat;5: Supervision Sitting - Scoot to [place] of Bed: 4: Min assist Details for Bed Mobility Assistance: increased effort due to patient unabel to use left arm but no physical assist needed with supine->sit; verbal sequencing cues to ease scooting EOB without use of LUE, did use pad to assist x1 with scooting his left hip Transfers Transfers: Sit to Stand;Stand to Sit Sit to Stand: 5: Supervision;From bed Stand to Sit: To chair/3-in-1;With upper extremity assist (with RUE) Details for Transfer Assistance: verbal cues for restricted use of LUE with transfers (pt had LUE in sling with sit->stand but needing more cues for stand->sit); needed backs of legs against bed to steady himsefl initially in standing but no physical assist  needed Ambulation/Gait Ambulation/Gait Assistance: 5: Supervision Ambulation Distance (Feet): 280 Feet Assistive device: Rolling walker Ambulation/Gait Assistance Details: cues for tall posture and safety Gait Pattern: Trunk flexed;Decreased stride length      PT Goals Acute Rehab PT Goals PT Goal: Supine/Side to Sit - Progress: Progressing toward goal PT Goal: Sit to Stand - Progress: Progressing toward goal PT Goal: Stand to Sit - Progress: Progressing toward goal PT Goal: Ambulate - Progress: Progressing toward goal  Visit Information  Last PT Received On: 07/18/12 Assistance Needed: +1    Subjective Data  Subjective: I haven't done much walking since I've been here.  Patient Stated Goal: home   Cognition  Overall Cognitive Status: Appears within functional limits for tasks assessed/performed Arousal/Alertness: Awake/alert Orientation Level: Appears intact for tasks assessed Behavior During Session: [facility] for tasks performed Cognition - Other Comments: reports some confusion this morning  (thought it was dinner time) but pt very aware of his confusion and now oriented to time and place    Balance  Balance Balance Assessed: Yes Static Standing Balance Static Standing - Balance Support: No upper extremity supported Static Standing - Level of Assistance: 5: Stand by assistance Static Standing - Comment/# of Minutes: pt using the backs of his legs initially upon standing to stabilize himself, with RW pt supervision-modified independent with bil. UE support  End of Session PT - End of Session Equipment Utilized During Treatment: Gait belt Activity Tolerance: Patient tolerated treatment well Patient left: in chair;with call bell/phone within reach Nurse Communication: Mobility status   GP     [facility]  HELEN 07/18/2012, 8:54 AM

## 2012-07-18 NOTE — Progress Notes (Signed)
Pt. Seen and examined. Agree with the NP/PA-C note as written.  He is doing well today, up and eating breakfast. Will need a repeat CXR today, but CXR yesterday looked stable with good lead placement. Interrogation today showed AV sequential pacing ~60%, normal lead impedance around 500 ohms. He is programmed DDIR. Pacer site is clean and dry without hematoma or discharge, 6 staples are in place. Wound was re-dressed. Plan probable discharge today after CXR if there are no abnormalities.  Follow-up with Dr. Royann Shivers.  Chrystie Nose, MD, [facility] Attending Cardiologist [facility]

## 2012-10-31 ENCOUNTER — Other Ambulatory Visit: Payer: Self-pay | Admitting: *Deleted

## 2012-10-31 MED ORDER — OMEPRAZOLE 20 MG PO CPDR: DELAYED_RELEASE_CAPSULE | ORAL | Status: AC

## 2012-12-05 ENCOUNTER — Telehealth: Payer: Self-pay | Admitting: Cardiovascular Disease

## 2012-12-05 ENCOUNTER — Other Ambulatory Visit: Payer: Self-pay | Admitting: Cardiovascular Disease

## 2012-12-05 LAB — CBC WITH DIFFERENTIAL/PLATELET
Eosinophils Relative: 1 % (ref 0–5)
HCT: 33.5 % — ABNORMAL LOW (ref 39.0–52.0)
Hemoglobin: 11.7 g/dL — ABNORMAL LOW (ref 13.0–17.0)
Lymphocytes Relative: 27 % (ref 12–46)
Lymphs Abs: 1.3 10*3/uL (ref 0.7–4.0)
MCV: 84.8 fL (ref 78.0–100.0)
Monocytes Absolute: 0.4 10*3/uL (ref 0.1–1.0)
Monocytes Relative: 8 % (ref 3–12)
RBC: 3.95 MIL/uL — ABNORMAL LOW (ref 4.22–5.81)
WBC: 5 10*3/uL (ref 4.0–10.5)

## 2012-12-05 LAB — COMPREHENSIVE METABOLIC PANEL
ALT: 9 U/L (ref 0–53)
CO2: 31 mEq/L (ref 19–32)
Calcium: 9.6 mg/dL (ref 8.4–10.5)
Chloride: 97 mEq/L (ref 96–112)
Creat: 1.11 mg/dL (ref 0.50–1.35)
Glucose, Bld: 85 mg/dL (ref 70–99)

## 2012-12-05 NOTE — Telephone Encounter (Signed)
Darren Rodriguez is calling because he cant figure out what KT is on his medication list and has some questions. Says its very hard to read t he writing .

## 2012-12-06 ENCOUNTER — Telehealth: Payer: Self-pay | Admitting: Cardiovascular Disease

## 2012-12-06 NOTE — Telephone Encounter (Signed)
Pt. Called and informed of his medications list the k is an abbreviation for potassium

## 2012-12-06 NOTE — Telephone Encounter (Signed)
Gave chart to Amber. °

## 2012-12-06 NOTE — Telephone Encounter (Signed)
Forwarded to JC 

## 2012-12-06 NOTE — Telephone Encounter (Signed)
Please call-he talked to lady yesterday late-she was suppose to call back this morning!

## 2012-12-25 NOTE — Telephone Encounter (Signed)
Darren Rodriguez from [facility] in [place] called -Darren Rodriguez became a resident on Saturday-need to ask some question about the history and physicall she receivednd! Her phone #[phone]

## 2012-12-25 NOTE — Telephone Encounter (Signed)
Returned call and spoke w/ Clydie Braun who needed clarity on handwritten info on pt's history from Dr. Alanda Amass.  Questions answered about orthostatic hypotension and venous insufficiency.  Clydie Braun did not have any other questions at the time.

## 2013-03-03 ENCOUNTER — Other Ambulatory Visit: Payer: Self-pay | Admitting: Internal Medicine

## 2013-10-30 ENCOUNTER — Telehealth: Payer: Self-pay

## 2013-10-30 NOTE — Telephone Encounter (Signed)
Message left on triage VM on 10/29/13  from Cecille Rubin to have nurse or Dr.Reed return call  I called Cecille Rubin and was told that she is working on an appeal from 2013 for Physical Therapy/ Occupational Therapy. Cecille Rubin was going to locate all her information and I will call her back in 30 minutes

## 2013-10-30 NOTE — Telephone Encounter (Signed)
I called Cecille Rubin back, Cecille Rubin indicates that Dr.Reed signed orders and evaluations on patient back in 2013. On three different occasions paperwork was signed more than 30 days after services provided and medicare needs a Delayed Certification to be signed by Dr.Reed. Cecille Rubin will fax over information, Cecille Rubin is aware Dr.Reed will not be in office until Thursday 11/01/13.  Dr.Reed with this provided information would you be willing to review information and sign paperwork, several OV notes from [facility] (old EMR system) were pulled and patient was an active patient in 2013.

## 2013-10-31 NOTE — Telephone Encounter (Signed)
Ok, that makes more sense if it is from 2013.

## 2013-11-02 NOTE — Telephone Encounter (Signed)
Paperwork was signed and faxed back.

## 2014-07-04 ENCOUNTER — Encounter (HOSPITAL_COMMUNITY): Payer: Self-pay | Admitting: Cardiovascular Disease

## 2015-02-19 ENCOUNTER — Encounter: Payer: Self-pay | Admitting: Cardiovascular Disease

## 2015-02-21 ENCOUNTER — Encounter: Payer: Self-pay | Admitting: Cardiovascular Disease

## 2015-03-27 ENCOUNTER — Encounter: Payer: Self-pay | Admitting: Cardiovascular Disease
